# Patient Record
Sex: Female | Born: 1990 | Race: White | Hispanic: Yes | Marital: Married | Smoking: Never smoker
Health system: Southern US, Community
[De-identification: ages and names within clinical notes are randomized; demographics above are authoritative.]

## PROBLEM LIST (undated history)

## (undated) ENCOUNTER — Inpatient Hospital Stay (HOSPITAL_COMMUNITY): Payer: Self-pay

## (undated) DIAGNOSIS — D649 Anemia, unspecified: Secondary | ICD-10-CM

## (undated) DIAGNOSIS — Z202 Contact with and (suspected) exposure to infections with a predominantly sexual mode of transmission: Secondary | ICD-10-CM

## (undated) HISTORY — DX: Contact with and (suspected) exposure to infections with a predominantly sexual mode of transmission: Z20.2

## (undated) HISTORY — DX: Anemia, unspecified: D64.9

## (undated) HISTORY — PX: NO PAST SURGERIES: SHX2092

---

## 2013-03-28 ENCOUNTER — Other Ambulatory Visit (HOSPITAL_COMMUNITY): Payer: Self-pay | Admitting: Nurse Practitioner

## 2013-03-28 DIAGNOSIS — Z3689 Encounter for other specified antenatal screening: Secondary | ICD-10-CM

## 2013-03-28 LAB — OB RESULTS CONSOLE RPR: RPR: NONREACTIVE

## 2013-03-28 LAB — OB RESULTS CONSOLE GC/CHLAMYDIA
Chlamydia: NEGATIVE
GC PROBE AMP, GENITAL: NEGATIVE

## 2013-03-28 LAB — OB RESULTS CONSOLE ABO/RH: RH TYPE: POSITIVE

## 2013-03-28 LAB — OB RESULTS CONSOLE HIV ANTIBODY (ROUTINE TESTING): HIV: NONREACTIVE

## 2013-03-28 LAB — OB RESULTS CONSOLE ANTIBODY SCREEN: ANTIBODY SCREEN: NEGATIVE

## 2013-03-28 LAB — OB RESULTS CONSOLE HEPATITIS B SURFACE ANTIGEN: Hepatitis B Surface Ag: NEGATIVE

## 2013-03-28 LAB — OB RESULTS CONSOLE RUBELLA ANTIBODY, IGM: Rubella: IMMUNE

## 2013-04-26 ENCOUNTER — Ambulatory Visit (HOSPITAL_COMMUNITY)
Admission: RE | Admit: 2013-04-26 | Discharge: 2013-04-26 | Disposition: A | Discharge status: Home / Self Care | Payer: Medicaid Other | Source: Ambulatory Visit | Attending: Nurse Practitioner | Admitting: Nurse Practitioner

## 2013-04-26 DIAGNOSIS — Z3689 Encounter for other specified antenatal screening: Secondary | ICD-10-CM

## 2013-05-19 NOTE — L&D Delivery Note (Signed)
Delivery Note At 2:54 PM a viable female was delivered via Vaginal, Spontaneous Delivery (Presentation: Right Occiput Anterior).  APGAR: 9, 9; weight: TBD.   Placenta status: Intact, Spontaneous.  Cord: 3 vessels with the following complications: none  Anesthesia: Epidural  Episiotomy: None Lacerations: 2nd degree perineal lac Suture Repair: 3.0 monocryl Est. Blood Loss (mL): 400cc  Mom to postpartum.  Baby to Couplet care / Skin to Skin.  Upon arrival patient was complete and had been pushing well for >1 hour. She continued to push with good effort, and delivered a healthy baby boy, placed on maternal abdomen for oral suctioning, drying and stimulation. Delayed cord clamping performed and cut by FOB. Placenta delivered intact with 3V cord. Vaginal canal and perineum was inspected with 2nd degree perineal laceration with angled extension to laceration, repaired in the usual fashion with 2x 3.0 monocryl suture repair, hemostatic on completion. Pitocin was started and uterus massaged until bleeding slowed. Counts of sharps, instruments, and lap pads were all correct.    Saralyn PilarAlexander Karamalegos, DO [facility], PGY-1 09/22/2013, 3:20 PM   I spoke with and examined patient and agree with resident's note and plan of care.  Tawana ScaleMichael Ryan Malavika Lira, MD OB Fellow 09/22/2013 4:56 PM

## 2013-05-31 ENCOUNTER — Encounter (HOSPITAL_COMMUNITY): Payer: Self-pay | Admitting: *Deleted

## 2013-05-31 ENCOUNTER — Inpatient Hospital Stay (HOSPITAL_COMMUNITY)
Admission: AD | Admit: 2013-05-31 | Discharge: 2013-06-01 | Disposition: A | Discharge status: Home / Self Care | Payer: Medicaid Other | Source: Ambulatory Visit | Attending: Obstetrics & Gynecology | Admitting: Obstetrics & Gynecology

## 2013-05-31 DIAGNOSIS — O9989 Other specified diseases and conditions complicating pregnancy, childbirth and the puerperium: Principal | ICD-10-CM

## 2013-05-31 DIAGNOSIS — R1013 Epigastric pain: Secondary | ICD-10-CM

## 2013-05-31 DIAGNOSIS — O99891 Other specified diseases and conditions complicating pregnancy: Secondary | ICD-10-CM | POA: Insufficient documentation

## 2013-05-31 NOTE — MAU Note (Signed)
PT SAYS  AT  9PM SHE STARTED  ON HER LEFT SIDE OF ABD-   NOW PAIN IS SAME.-   NO MED FOR PAIN.  GETS PNC AT [facility]- SEEN LAST   IN JAN  5TH.  NEXT IS 1-26.   LAST  SEX- 1-10.     DENIES AB=NY S/S  WHEN VOIDING.

## 2013-06-01 LAB — URINALYSIS, ROUTINE W REFLEX MICROSCOPIC
Bilirubin Urine: NEGATIVE
Glucose, UA: NEGATIVE mg/dL
Hgb urine dipstick: NEGATIVE
Ketones, ur: NEGATIVE mg/dL
Leukocytes, UA: NEGATIVE
NITRITE: NEGATIVE
PH: 5.5 (ref 5.0–8.0)
Protein, ur: NEGATIVE mg/dL
Specific Gravity, Urine: 1.01 (ref 1.005–1.030)
Urobilinogen, UA: 0.2 mg/dL (ref 0.0–1.0)

## 2013-06-01 NOTE — MAU Provider Note (Signed)
Chief Complaint:  No chief complaint on file.   First Provider Initiated Contact with Patient 06/01/13 0013      HPI: Connie Ferguson is a 23 y.o. G1P0000 at 1375w2d who presents to maternity admissions reporting epigastric pain that began around 9 PM tonight.  Pt describes as minimal pain, constant in nature, not reproducible, does not change with movement, and she has never had this before.  Non radiating in nature, she does not have any nausea or vomiting, and states she has had good fetal movement w/o contractions, leakage of fluid or vaginal bleeding. Last sexual intercourse 3 days ago and denies any dysuria, hematuria.  Pt denies hx of reflux or recent spicy meals.  No SOB, CP, or palpitations.   Pregnancy Course: Uncomplicated to this point   Past Medical History: History reviewed. No pertinent past medical history.  Past obstetric history: OB History  Gravida Para Term Preterm AB SAB TAB Ectopic Multiple Living  1 0 0 0 0 0 0 0 0 0     # Outcome Date GA Lbr Len/2nd Weight Sex Delivery Anes PTL Lv  1 CUR               Past Surgical History: Past Surgical History  Procedure Laterality Date  . No past surgeries       Family History: History reviewed. No pertinent family history.  Social History: History  Substance Use Topics  . Smoking status: Never Smoker   . Smokeless tobacco: Not on file  . Alcohol Use: No    Allergies: No Known Allergies  Meds:  Prescriptions prior to admission  Medication Sig Dispense Refill  . Prenatal Vit-Fe Fumarate-FA (PRENATAL MULTIVITAMIN) TABS tablet Take 1 tablet by mouth daily at 12 noon.        ROS: Pertinent findings in history of present illness.  Physical Exam  Blood pressure 104/64, pulse 77, temperature 98 F (36.7 C), temperature source Oral, resp. rate 18, height 5\' 2"  (1.575 m), weight 64.071 kg (141 lb 4 oz). GENERAL: Well-developed, well-nourished female in no acute distress.  HEENT: normocephalic HEART:  normal rate RESP: normal effort ABDOMEN: Soft, non-tender, gravid appropriate for gestational age, no reproducible TTP, no guarding or rigidity, no HSM EXTREMITIES: Nontender, no edema NEURO: alert and oriented  FHT:  Baseline 140 , moderate variability, accelerations present, no decelerations Contractions: Absent    Labs: Results for orders placed during the hospital encounter of 05/31/13 (from the past 24 hour(s))  URINALYSIS, ROUTINE W REFLEX MICROSCOPIC     Status: None   Collection Time    05/31/13 11:36 PM      Result Value Range   Color, Urine YELLOW  YELLOW   APPearance CLEAR  CLEAR   Specific Gravity, Urine 1.010  1.005 - 1.030   pH 5.5  5.0 - 8.0   Glucose, UA NEGATIVE  NEGATIVE mg/dL   Hgb urine dipstick NEGATIVE  NEGATIVE   Bilirubin Urine NEGATIVE  NEGATIVE   Ketones, ur NEGATIVE  NEGATIVE mg/dL   Protein, ur NEGATIVE  NEGATIVE mg/dL   Urobilinogen, UA 0.2  0.0 - 1.0 mg/dL   Nitrite NEGATIVE  NEGATIVE   Leukocytes, UA NEGATIVE  NEGATIVE    Imaging:  No results found.  Assessment: 1. Epigastric pain    Plan: Unsure of etiology but could be reflux.  However, pt in NAD, her abdominal exam is normal and her FHT is reassuring.  Recommend trying some OTC Zantac, Tums, and tylenol for discomfort.  Discharge home  with specific instructions on return to MAU  Labor precautions and fetal kick counts    Medication List         prenatal multivitamin Tabs tablet  Take 1 tablet by mouth daily at 12 noon.        Briscoe Deutscher, DO 06/01/2013 12:20 AM  I was consulted regarding plan of care and agree with the above.  [place], CNM 06/03/2013 6:03 AM

## 2013-06-01 NOTE — Discharge Instructions (Signed)
Dolor abdominal en el embarazo  (Abdominal Pain During Pregnancy)  El dolor abdominal es frecuente durante el embarazo. Generalmente no causa ningún daño. El dolor abdominal puede tener numerosas causas. Algunas causas son más graves que otras. Ciertas causas de dolor abdominal durante el embarazo se diagnostican fácilmente. A veces, se tarda un tiempo para llegar al diagnóstico. Otras veces la causa no se conoce. El dolor abdominal puede estar relacionado con alguna alteración del embarazo, o puede deberse a una causa totalmente diferente. Por este motivo, siempre consulte a su médico cuando sienta molestias abdominales.  INSTRUCCIONES PARA EL CUIDADO EN EL HOGAR   Esté atenta al dolor para ver si hay cambios. Las siguientes indicaciones ayudarán a aliviar cualquier molestia que pueda sentir:  · No tenga relaciones sexuales y no coloque nada dentro de la vagina hasta que los síntomas hayan desaparecido completamente.  · Descanse todo lo que pueda hasta que el dolor se le haya calmado.  · Si siente náuseas, beba líquidos claros. Evite los alimentos sólidos mientras sienta malestar o tenga náuseas.  · Tome sólo medicamentos de venta libre o recetados, según las indicaciones del médico.  · Cumpla con todas las visitas de control, según le indique su médico.  SOLICITE ATENCIÓN MÉDICA DE INMEDIATO SI:  · Tiene un sangrado, pérdida de líquidos o elimina tejidos por la vagina.  · El dolor o los cólicos aumentan.  · Tiene vómitos persistentes.  · Comienza a sentir dolor al orinar u observa sangre.  · Tiene fiebre.  · Nota que los movimientos del bebé disminuyen.  · Siente intensa debilidad o se marea.  · Tiene dificultad para respirar con o sin dolor abdominal.  · Siente un dolor de cabeza intenso junto al dolor abdominal.  · Tiene una secreción vaginal anormal con dolor abdominal.  · Tiene diarrea persistente.  · El dolor abdominal sigue o empeora aún después de hacer reposo.  ASEGÚRESE DE QUE:   · Comprende estas  instrucciones.  · Controlará su afección.  · Recibirá ayuda de inmediato si no mejora o si empeora.  Document Released: 05/05/2005 Document Revised: 02/23/2013  ExitCare® Patient Information ©2014 [employer].

## 2013-06-05 NOTE — MAU Provider Note (Signed)
Attestation of Attending Supervision of Advanced Practitioner (CNM/NP): Evaluation and management procedures were performed by the Advanced Practitioner under my supervision and collaboration. I have reviewed the Advanced Practitioner's note and chart, and I agree with the management and plan.  LEGGETT,KELLY H. 9:13 PM

## 2013-06-27 ENCOUNTER — Other Ambulatory Visit: Payer: Self-pay

## 2013-07-13 ENCOUNTER — Ambulatory Visit (HOSPITAL_COMMUNITY): Payer: Medicaid Other

## 2013-07-19 ENCOUNTER — Ambulatory Visit (HOSPITAL_COMMUNITY)
Admission: RE | Admit: 2013-07-19 | Discharge: 2013-07-19 | Disposition: A | Discharge status: Home / Self Care | Payer: Medicaid Other | Source: Ambulatory Visit | Attending: Obstetrics & Gynecology | Admitting: Obstetrics & Gynecology

## 2013-07-19 DIAGNOSIS — O352XX0 Maternal care for (suspected) hereditary disease in fetus, not applicable or unspecified: Secondary | ICD-10-CM

## 2013-07-19 NOTE — ED Notes (Signed)
Appointment Date: 07/19/2013 DOB: 06-15-1990 Referring Provider: [place] Attending: Renella Cunas, MD  I met with [name] and her husband, 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  Spanish interpreter was provided.  We reviewed the remainder of their family histories.  [name] has not yet had hemoglobin electrophoresis, as best he knows.  [name] reported no relatives known to be beta thalassemia carriers and no known relatives with beta thalassemia.  [name] reported no known individuals with beta thalassemia in his family history, and consanguinity to [name] was denied. He reported that his family is from [place]. Both family histories were reviewed and were otherwise negative for birth defects, mental retardation, and known genetic conditions. Without further information regarding the provided family history, an accurate genetic risk cannot be calculated. Further genetic counseling is warranted if more information is obtained.  We reviewed the results of [name]'s hemoglobin electrophoresis.  We reviewed that hemoglobin is a protein in red blood cells that carries oxygen to the bodys organs. There are multiple types of hemoglobin, and these are comprised of different subunits.  We discussed that the primary type of adult hemoglobin is hemoglobin A (HbA).  It is a tetramer composed of two alpha chains and two beta chains.  [name] results showed that she has a reduced level of HbA, increased levels of Hb A2 and increased levels of HbF.  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  [name] reports that she is from [place]. The  carrier frequency of beta thalassemia in [place] is 1 in 91.  We discussed that beta thalassemia is a genetic condition characterized by reduced synthesis of the beta globin chain.  Individuals with beta thalassemia have a change within each copy of the gene that codes for the beta globin chain.  They were counseled that beta thalassemia results in microcytic, hypochromic anemia.  The anemia is severe and typically leads to hepatosplenomegaly.  Symptoms become obvious within the first two years of life, as a baby tries to transition from fetal hemoglobin to hemoglobin A. Beta thalassemia is typically treated with regular transfusions and chelation therapy, aimed at reducing transfusion iron overload.  We discussed that this treatment regimen allows for normal growth and development and may improve the overall prognosis.  Without treatment, individuals with beta-thalassemia major have failure to thrive, feeding problems, and a shortened lifespan.   They were counseled that beta-thalassemia is inherited in an autosomal recessive manner, and occurs when both copies of the beta hemoglobin gene are changed. They understand that in order to have a child with beta thalassemia, they would both have to be carriers of a beta globin chain variant.   We discussed that beta thalassemia trait can be inherited with other beta globin chain variants, such as sickle cell trait (Hb AS) to also cause other variant hemoglobinopathies, such as sickle- beta thalassemia.  Carriers of recessive conditions typically do not have symptoms related to the condition, because they still have one functioning copy of the gene, and thus some production of the typical protein coded for by that gene.   Given the recessive inheritance, we discussed the importance of understanding [name]' status in order to accurately predict the risk of beta-thalassemia  or other hemoglobinopathy in the fetus. We discussed the option of hemoglobin  electrophoresis with a complete blood count and serum ferritin levels to determine whether he has beta-thalassemia trait, or any hemoglobin variant (including sickle cell trait). [name] declined testing today.  We discussed that this screening can be facilitated through his 59 office later if he wishes.  They understand that an accurate risk assessment cannot be performed without further information.  However, given that [name] is from [place], we discussed that the chance for him to be a carrier of Hb S is approximate 1 in 9, the chance for him to be a carrier for Hb C is approximately 1 in 8 and the chance for him to be a carrier for beta thalassemia is approximately 1 in 84.   This considered, the risk for a clinically significant hemoglobinopathy is approximately 1 in 38. They understand that if [name] has typical hemoglobin, then the pregnancy would not be at risk to inherit a hemoglobinopathy but would have a 1 in 2 chance to have beta-thalassemia trait like [name].   They were also counseled about newborn screening in [place] for hemoglobinopathies.  Given her late gestation, they decided to wait until newborn screening to learn more.    [name] denied exposure to environmental toxins or chemical agents. She denied the use of alcohol, tobacco or street drugs. She denied significant viral illnesses during the course of her pregnancy. Her medical and surgical histories were noncontributory.   I counseled this couple, through a [place] interpreter, regarding the above risks and available options.  The approximate face-to-face time with the genetic counselor was 45 minutes.  Cam Hai, MS Certified Genetic Counselor 07/19/2013

## 2013-08-22 LAB — OB RESULTS CONSOLE GBS: GBS: NEGATIVE

## 2013-09-19 ENCOUNTER — Other Ambulatory Visit (HOSPITAL_COMMUNITY): Payer: Self-pay | Admitting: Nurse Practitioner

## 2013-09-19 DIAGNOSIS — O480 Post-term pregnancy: Secondary | ICD-10-CM

## 2013-09-20 ENCOUNTER — Telehealth (HOSPITAL_COMMUNITY): Payer: Self-pay | Admitting: *Deleted

## 2013-09-20 ENCOUNTER — Encounter (HOSPITAL_COMMUNITY): Payer: Self-pay | Admitting: *Deleted

## 2013-09-20 NOTE — Telephone Encounter (Signed)
Preadmission screen [id number] interpreter number

## 2013-09-21 ENCOUNTER — Encounter (HOSPITAL_COMMUNITY): Payer: Self-pay

## 2013-09-21 ENCOUNTER — Inpatient Hospital Stay (HOSPITAL_COMMUNITY)
Admission: RE | Admit: 2013-09-21 | Discharge: 2013-09-24 | DRG: 775 | Disposition: A | Discharge status: Home / Self Care | Payer: Medicaid Other | Source: Ambulatory Visit | Attending: Obstetrics and Gynecology | Admitting: Obstetrics and Gynecology

## 2013-09-21 DIAGNOSIS — Z833 Family history of diabetes mellitus: Secondary | ICD-10-CM

## 2013-09-21 DIAGNOSIS — O9902 Anemia complicating childbirth: Secondary | ICD-10-CM | POA: Diagnosis present

## 2013-09-21 DIAGNOSIS — D649 Anemia, unspecified: Secondary | ICD-10-CM | POA: Diagnosis present

## 2013-09-21 DIAGNOSIS — O480 Post-term pregnancy: Secondary | ICD-10-CM | POA: Diagnosis present

## 2013-09-21 DIAGNOSIS — Z3A42 42 weeks gestation of pregnancy: Secondary | ICD-10-CM

## 2013-09-21 LAB — RPR

## 2013-09-21 LAB — CBC
HEMATOCRIT: 30.3 % — AB (ref 36.0–46.0)
Hemoglobin: 9.9 g/dL — ABNORMAL LOW (ref 12.0–15.0)
MCH: 21.2 pg — AB (ref 26.0–34.0)
MCHC: 32.7 g/dL (ref 30.0–36.0)
MCV: 64.9 fL — AB (ref 78.0–100.0)
PLATELETS: 267 10*3/uL (ref 150–400)
RBC: 4.67 MIL/uL (ref 3.87–5.11)
RDW: 15.9 % — AB (ref 11.5–15.5)
WBC: 12.7 10*3/uL — AB (ref 4.0–10.5)

## 2013-09-21 LAB — TYPE AND SCREEN
ABO/RH(D): O POS
Antibody Screen: NEGATIVE

## 2013-09-21 LAB — ABO/RH: ABO/RH(D): O POS

## 2013-09-21 MED ORDER — OXYTOCIN 40 UNITS IN LACTATED RINGERS INFUSION - SIMPLE MED
62.5000 mL/h | INTRAVENOUS | Status: DC
  Filled 2013-09-21: qty 1000

## 2013-09-21 MED ORDER — ACETAMINOPHEN 325 MG PO TABS: 650.0000 mg | ORAL_TABLET | ORAL | Status: DC | PRN

## 2013-09-21 MED ORDER — MISOPROSTOL 25 MCG QUARTER TABLET
25.0000 ug | ORAL_TABLET | ORAL | Status: DC | PRN
  Administered 2013-09-21: 25 ug via VAGINAL
  Filled 2013-09-21: qty 1
  Filled 2013-09-21: qty 0.25

## 2013-09-21 MED ORDER — IBUPROFEN 600 MG PO TABS
600.0000 mg | ORAL_TABLET | Freq: Four times a day (QID) | ORAL | Status: DC | PRN
  Administered 2013-09-22: 600 mg via ORAL
  Filled 2013-09-21: qty 1

## 2013-09-21 MED ORDER — TERBUTALINE SULFATE 1 MG/ML IJ SOLN: 0.2500 mg | Freq: Once | INTRAMUSCULAR | Status: AC | PRN

## 2013-09-21 MED ORDER — DIPHENHYDRAMINE HCL 50 MG/ML IJ SOLN: 12.5000 mg | INTRAMUSCULAR | Status: DC | PRN

## 2013-09-21 MED ORDER — PHENYLEPHRINE 40 MCG/ML (10ML) SYRINGE FOR IV PUSH (FOR BLOOD PRESSURE SUPPORT)
80.0000 ug | PREFILLED_SYRINGE | INTRAVENOUS | Status: DC | PRN
  Administered 2013-09-22: 80 ug via INTRAVENOUS
  Filled 2013-09-21: qty 2

## 2013-09-21 MED ORDER — LACTATED RINGERS IV SOLN
INTRAVENOUS | Status: DC
  Administered 2013-09-21 – 2013-09-22 (×4): via INTRAVENOUS

## 2013-09-21 MED ORDER — LACTATED RINGERS IV SOLN: 500.0000 mL | INTRAVENOUS | Status: DC | PRN

## 2013-09-21 MED ORDER — FENTANYL CITRATE 0.05 MG/ML IJ SOLN
INTRAMUSCULAR | Status: AC
  Administered 2013-09-21: 100 ug via INTRAVENOUS
  Filled 2013-09-21: qty 2

## 2013-09-21 MED ORDER — FENTANYL CITRATE 0.05 MG/ML IJ SOLN
100.0000 ug | INTRAMUSCULAR | Status: DC | PRN
  Administered 2013-09-21 – 2013-09-22 (×3): 100 ug via INTRAVENOUS
  Filled 2013-09-21 (×2): qty 2

## 2013-09-21 MED ORDER — ONDANSETRON HCL 4 MG/2ML IJ SOLN: 4.0000 mg | Freq: Four times a day (QID) | INTRAMUSCULAR | Status: DC | PRN

## 2013-09-21 MED ORDER — FENTANYL 2.5 MCG/ML BUPIVACAINE 1/10 % EPIDURAL INFUSION (WH - ANES)
14.0000 mL/h | INTRAMUSCULAR | Status: DC | PRN
  Administered 2013-09-22: 14 mL/h via EPIDURAL
  Filled 2013-09-21 (×2): qty 125

## 2013-09-21 MED ORDER — OXYTOCIN 40 UNITS IN LACTATED RINGERS INFUSION - SIMPLE MED
1.0000 m[IU]/min | INTRAVENOUS | Status: DC
  Administered 2013-09-21: 2 m[IU]/min via INTRAVENOUS

## 2013-09-21 MED ORDER — OXYCODONE-ACETAMINOPHEN 5-325 MG PO TABS: 1.0000 | ORAL_TABLET | ORAL | Status: DC | PRN

## 2013-09-21 MED ORDER — LACTATED RINGERS IV SOLN: 500.0000 mL | Freq: Once | INTRAVENOUS | Status: DC

## 2013-09-21 MED ORDER — OXYTOCIN BOLUS FROM INFUSION: 500.0000 mL | INTRAVENOUS | Status: DC

## 2013-09-21 MED ORDER — EPHEDRINE 5 MG/ML INJ
10.0000 mg | INTRAVENOUS | Status: DC | PRN
  Filled 2013-09-21: qty 2
  Filled 2013-09-21: qty 4

## 2013-09-21 MED ORDER — EPHEDRINE 5 MG/ML INJ
10.0000 mg | INTRAVENOUS | Status: DC | PRN
  Filled 2013-09-21: qty 2

## 2013-09-21 MED ORDER — PHENYLEPHRINE 40 MCG/ML (10ML) SYRINGE FOR IV PUSH (FOR BLOOD PRESSURE SUPPORT)
80.0000 ug | PREFILLED_SYRINGE | INTRAVENOUS | Status: DC | PRN
  Filled 2013-09-21: qty 10
  Filled 2013-09-21: qty 2
  Filled 2013-09-21: qty 10

## 2013-09-21 MED ORDER — ZOLPIDEM TARTRATE 5 MG PO TABS: 5.0000 mg | ORAL_TABLET | Freq: Every evening | ORAL | Status: DC | PRN

## 2013-09-21 MED ORDER — CITRIC ACID-SODIUM CITRATE 334-500 MG/5ML PO SOLN: 30.0000 mL | ORAL | Status: DC | PRN

## 2013-09-21 MED ORDER — LIDOCAINE HCL (PF) 1 % IJ SOLN
30.0000 mL | INTRAMUSCULAR | Status: DC | PRN
  Filled 2013-09-21: qty 30

## 2013-09-21 NOTE — H&P (Signed)
Janeshia Alethia BertholdLeticia Carman Chingonce Melendez is a 23 y.o. female G1P0000 with IUP at 5160w2d presenting for IOL for postdates.  Interview conducted with assistance of Spanish interpreter in room.  Patient reports that she is feeling irregular occasional contractions 3-4x daily, without significant change recently. No other complaints at this time. States she was scheduled for recent US, but was sent here instead.  Admits good Fetal Movement. Denies any leakage of fluids, vaginal bleeding.  [facility]/Complications[facility], dating by LMP/US. Pregnancy without any significant complications. Reviewed prenatal records, documented anemia in pregnancy, previously treated trichomonas. Denies any other complications or hospitalizations.  Past Medical History: Past Medical History  Diagnosis Date  . Anemia   . Trichomonas contact, treated    Denies any other prior medical hx or current medications.  Past Surgical History: Past Surgical History  Procedure Laterality Date  . No past surgeries      Obstetrical History: OB History   Grav Para Term Preterm Abortions TAB SAB Ect Mult Living   1 0 0 0 0 0 0 0 0 0       Social History: History   Social History  . Marital Status: Significant Other    Spouse Name: N/A    Number of Children: N/A  . Years of Education: N/A   Social History Main Topics  . Smoking status: Never Smoker   . Smokeless tobacco: Never Used  . Alcohol Use: No  . Drug Use: No  . Sexual Activity: Yes   Other Topics Concern  . None   Social History Narrative  . None    Family History: Family History  Problem Relation Age of Onset  . Diabetes Mother   . Alcohol abuse Neg Hx   . Arthritis Neg Hx   . Asthma Neg Hx   . Birth defects Neg Hx   . Cancer Neg Hx   . COPD Neg Hx   . Depression Neg Hx   . Drug abuse Neg Hx   . Early death Neg Hx   . Hearing loss Neg Hx   . Heart disease Neg Hx   . Hyperlipidemia Neg Hx   . Hypertension Neg Hx    . Kidney disease Neg Hx   . Learning disabilities Neg Hx   . Mental illness Neg Hx   . Mental retardation Neg Hx   . Miscarriages / Stillbirths Neg Hx   . Stroke Neg Hx   . Vision loss Neg Hx     Allergies: No Known Allergies  Prescriptions prior to admission  Medication Sig Dispense Refill  . calcium carbonate (TUMS - DOSED IN MG ELEMENTAL CALCIUM) 500 MG chewable tablet Chew 1 tablet by mouth daily as needed for indigestion or heartburn.      . Prenatal Vit-Fe Fumarate-FA (PRENATAL MULTIVITAMIN) TABS tablet Take 1 tablet by mouth daily at 12 noon.        Review of Systems: Negative unless otherwise stated in History above  Physicial Blood pressure 124/74, pulse 99, temperature 98.5 F (36.9 C), temperature source Oral, resp. rate 18, height 5\' 2"  (1.575 m), weight 74.844 kg (165 lb). General appearance: alert and cooperative, comfortable not feeling UCs, NAD Lungs: CTAB Heart: RRR, no murmurs Abdomen: soft, non-tender, gravid appropriate for GA, bowel sounds normal Extremities: Homans sign is negative, no sign of DVT DTR's +2 b/l patellar, no clonus Presentation: cephalic (confirmed bedside US)  Fetal monitoring Baseline: 130 bpm, Variability: moderate, Accelerations: Reactive and Decelerations: Absent Uterine activity: irregular  UCs q 6-3610min on toco.     Prenatal labs: ABO, Rh: O/Positive/-- (11/10 0000) Antibody: Negative (11/10 0000) Rubella:   immune RPR: Nonreactive (11/10 0000)  HBsAg: Negative (11/10 0000)  HIV: Non-reactive (11/10 0000)  GBS: Negative (04/06 0000)  GTT: 1 hr screen, 81  Prenatal Transfer Tool  Maternal Diabetes: No Genetic Screening: Normal Maternal Ultrasounds/Referrals: Normal Fetal Ultrasounds or other Referrals:  None Maternal Substance Abuse:  No Significant Maternal Medications:  None Significant Maternal Lab Results: Lab values include: Group B Strep negative  No results found for this or any previous visit (from the past 24  hour(s)).  Assessment: Nanda QuintonFlor [name] is a 23 y.o. G1P0000 at 6536w2d by LMP/US, admitted for IOL for post-dates.  #Labor: IOL, active management. Start Cytotec 25mcg PV q 4 hr, may have regular diet while cytotec and irregular UCs, with progression switch to clear liquid / light labor diet #Pain: consider Fentanyl IV PRN, planning Epidural #FWB: CAT-1, EFW (Leopold's estimate 7 lb) #ID: GBS (negative) #Feeding: Breast / Bottle #MOC: Depo-provera #Circ: Not in hospital  Saralyn PilarAlexander Karamalegos, DO [facility], PGY-1  09/21/2013, 8:59 AM   I spoke with and examined patient and agree with resident's note and plan of care.  Tawana ScaleMichael Ryan Jael Waldorf, MD OB Fellow 09/21/2013 11:32 AM

## 2013-09-21 NOTE — Progress Notes (Signed)
Connie Alethia BertholdLeticia Carman Chingonce Ferguson is a 23 y.o. G1P0000 at 2643w2d   Subjective: Mostly comfortable  Objective: BP 114/72  Pulse 81  Temp(Src) 98.4 F (36.9 C) (Oral)  Resp 18  Ht 5\' 2"  (1.575 m)  Wt 74.844 kg (165 lb)  BMI 30.17 kg/m2      FHT:  FHR: 130s bpm, variability: moderate,  accelerations:  Present,  decelerations:  Absent UC:   irregular, every 3-6 minutes SVE:   Dilation: 4.5 Effacement (%): 50 Station: -3 Exam by:: Connie Ferguson CNM  Labs: Lab Results  Component Value Date   WBC 12.7* 09/21/2013   HGB 9.9* 09/21/2013   HCT 30.3* 09/21/2013   MCV 64.9* 09/21/2013   PLT 267 09/21/2013    Assessment / Plan: IOL process for postdates Favorable cx  Begin Pitocin and titrate to achieve adequate labor Connie MerlesKimberly D [name] 09/21/2013, 11:42 PM

## 2013-09-21 NOTE — Progress Notes (Signed)
Connie Ferguson is a 23 y.o. G1P0000 at 1990w2d by LMP/US admitted for induction of labor due to Post dates.  Subjective: Comfortable resting in bed. Denies feeling any pain with regular UCs.  Objective: BP 116/70  Pulse 89  Temp(Src) 98.9 F (37.2 C) (Oral)  Resp 18  Ht 5\' 2"  (1.575 m)  Wt 74.844 kg (165 lb)  BMI 30.17 kg/m2      FHT:  FHR: 135 bpm, variability: moderate,  accelerations:  Present,  decelerations:  Absent UC:   regular, every 2-3 minutes, palpable tightening (not painful) SVE:   Dilation: 1.5 Effacement (%): 50 Station: -3 Exam by:: dr. Blythe Stanfordkaramelagos  Labs: Lab Results  Component Value Date   WBC 12.7* 09/21/2013   HGB 9.9* 09/21/2013   HCT 30.3* 09/21/2013   MCV 64.9* 09/21/2013   PLT 267 09/21/2013    Assessment / Plan: Connie Ferguson is a 23 y.o. G1P0000 at 4990w2d by LMP/US admitted for IOL postdates  Labor: IOL. S/p Cytotec PV x 1 dose. Small cervical progress to 1.5/50%, inc ctx 2-3 min on monitor. Hold cytotec due to ctx, and placed FB @ 1407.  Preeclampsia:  n/a Fetal Wellbeing:  Category I Pain Control:  No pain meds currently. Planning for Epidural. Considering Fentanyl IV I/D:  GBS (negative) Anticipated MOD:  NSVD  Saralyn PilarAlexander Karamalegos, DO [facility], PGY-1 09/21/2013, 2:11 PM

## 2013-09-21 NOTE — Progress Notes (Signed)
Connie Alethia BertholdLeticia Carman Chingonce Ferguson is a 23 y.o. G1P0000 at 5124w2d by LMP/US admitted for induction of labor due to Post dates.  Subjective: Increasing pain and pelvic pressure with regular UCs. Requesting IV pain medicine, still planning for epidural in future.  FB came out around 1600.  Objective: BP 116/70  Pulse 89  Temp(Src) 98.9 F (37.2 C) (Oral)  Resp 18  Ht 5\' 2"  (1.575 m)  Wt 74.844 kg (165 lb)  BMI 30.17 kg/m2      FHT:  FHR: 130 bpm, variability: moderate,  accelerations:  Present,  decelerations:  Absent UC:   regular, every 1.5 - 2 minutes SVE:   Dilation: 4.5 Effacement (%): 50 Station: -3 Exam by:: hk/  Labs: Lab Results  Component Value Date   WBC 12.7* 09/21/2013   HGB 9.9* 09/21/2013   HCT 30.3* 09/21/2013   MCV 64.9* 09/21/2013   PLT 267 09/21/2013    Assessment / Plan: Connie Ferguson Connie Ferguson is a 23 y.o. G1P0000 at 3624w2d by LMP/US admitted for IOL postdates  Labor: IOL. S/p Cytotec PV x 1, FB out 1600 (in x 2 hours). Good cervical progress, regular frequent ctx. Continue to monitor and re-check. No pitocin at this time.  Preeclampsia:  n/a Fetal Wellbeing:  Category I Pain Control:  Fentanyl IV PRN. Planning epidural I/D:  GBS (negative) Anticipated MOD:  NSVD  Connie PilarAlexander Joclynn Lumb, DO [facility], PGY-1 09/21/2013, 4:11 PM

## 2013-09-22 ENCOUNTER — Inpatient Hospital Stay (HOSPITAL_COMMUNITY): Payer: Medicaid Other | Admitting: Anesthesiology

## 2013-09-22 ENCOUNTER — Inpatient Hospital Stay (HOSPITAL_COMMUNITY): Admission: RE | Admit: 2013-09-22 | Payer: Medicaid Other | Source: Ambulatory Visit

## 2013-09-22 ENCOUNTER — Encounter (HOSPITAL_COMMUNITY): Payer: Self-pay

## 2013-09-22 ENCOUNTER — Encounter (HOSPITAL_COMMUNITY): Payer: Medicaid Other | Admitting: Anesthesiology

## 2013-09-22 MED ORDER — TETANUS-DIPHTH-ACELL PERTUSSIS 5-2.5-18.5 LF-MCG/0.5 IM SUSP
0.5000 mL | Freq: Once | INTRAMUSCULAR | Status: AC
  Administered 2013-09-23: 0.5 mL via INTRAMUSCULAR
  Filled 2013-09-22: qty 0.5

## 2013-09-22 MED ORDER — LANOLIN HYDROUS EX OINT: TOPICAL_OINTMENT | CUTANEOUS | Status: DC | PRN

## 2013-09-22 MED ORDER — OXYCODONE-ACETAMINOPHEN 5-325 MG PO TABS
1.0000 | ORAL_TABLET | ORAL | Status: DC | PRN
  Administered 2013-09-23: 1 via ORAL
  Filled 2013-09-22: qty 1

## 2013-09-22 MED ORDER — ONDANSETRON HCL 4 MG PO TABS: 4.0000 mg | ORAL_TABLET | ORAL | Status: DC | PRN

## 2013-09-22 MED ORDER — LIDOCAINE HCL (PF) 1 % IJ SOLN
INTRAMUSCULAR | Status: DC | PRN
  Administered 2013-09-22: 5 mL
  Administered 2013-09-22: 3 mL
  Administered 2013-09-22: 5 mL

## 2013-09-22 MED ORDER — WITCH HAZEL-GLYCERIN EX PADS: 1.0000 "application " | MEDICATED_PAD | CUTANEOUS | Status: DC | PRN

## 2013-09-22 MED ORDER — BENZOCAINE-MENTHOL 20-0.5 % EX AERO
1.0000 "application " | INHALATION_SPRAY | CUTANEOUS | Status: DC | PRN
  Administered 2013-09-22: 1 via TOPICAL
  Filled 2013-09-22: qty 56

## 2013-09-22 MED ORDER — ZOLPIDEM TARTRATE 5 MG PO TABS: 5.0000 mg | ORAL_TABLET | Freq: Every evening | ORAL | Status: DC | PRN

## 2013-09-22 MED ORDER — IBUPROFEN 600 MG PO TABS
600.0000 mg | ORAL_TABLET | Freq: Four times a day (QID) | ORAL | Status: DC
Start: 2013-09-22 — End: 2013-09-24
  Administered 2013-09-22 – 2013-09-24 (×7): 600 mg via ORAL
  Filled 2013-09-22 (×7): qty 1

## 2013-09-22 MED ORDER — PRENATAL MULTIVITAMIN CH
1.0000 | ORAL_TABLET | Freq: Every day | ORAL | Status: DC
  Administered 2013-09-23 – 2013-09-24 (×2): 1 via ORAL
  Filled 2013-09-22 (×2): qty 1

## 2013-09-22 MED ORDER — DIBUCAINE 1 % RE OINT: 1.0000 "application " | TOPICAL_OINTMENT | RECTAL | Status: DC | PRN

## 2013-09-22 MED ORDER — DIPHENHYDRAMINE HCL 25 MG PO CAPS: 25.0000 mg | ORAL_CAPSULE | Freq: Four times a day (QID) | ORAL | Status: DC | PRN

## 2013-09-22 MED ORDER — SIMETHICONE 80 MG PO CHEW: 80.0000 mg | CHEWABLE_TABLET | ORAL | Status: DC | PRN

## 2013-09-22 MED ORDER — FENTANYL 2.5 MCG/ML BUPIVACAINE 1/10 % EPIDURAL INFUSION (WH - ANES)
INTRAMUSCULAR | Status: DC | PRN
  Administered 2013-09-22: 14 mL/h via EPIDURAL

## 2013-09-22 MED ORDER — SENNOSIDES-DOCUSATE SODIUM 8.6-50 MG PO TABS
2.0000 | ORAL_TABLET | ORAL | Status: DC
  Administered 2013-09-22: 2 via ORAL
  Filled 2013-09-22 (×2): qty 2

## 2013-09-22 MED ORDER — ONDANSETRON HCL 4 MG/2ML IJ SOLN: 4.0000 mg | INTRAMUSCULAR | Status: DC | PRN

## 2013-09-22 NOTE — Progress Notes (Signed)
Kenishia Alethia BertholdLeticia Carman Chingonce Melendez is a 23 y.o. G1P0000 at 7335w3d admitted for IOL for postdates  Subjective: Pushing for past 15 min, doing well, s/p epidural.  Objective: BP 114/80  Pulse 101  Temp(Src) 99.9 F (37.7 C) (Oral)  Resp 20  Ht 5\' 2"  (1.575 m)  Wt 74.844 kg (165 lb)  BMI 30.17 kg/m2  SpO2 96% I/O last 3 completed shifts: In: -  Out: 1150 [Urine:1150]    FHT:  Baseline 150s, +accels (15x15), moderate variability, no decels. UC:  Regular q 2-3 min SVE:   Dilation: 10 Effacement (%): 90 Station: 0 Exam by:: hk Clear fluid Labs: Lab Results  Component Value Date   WBC 12.7* 09/21/2013   HGB 9.9* 09/21/2013   HCT 30.3* 09/21/2013   MCV 64.9* 09/21/2013   PLT 267 09/21/2013    Assessment / Plan: Nanda QuintonFlor [name] is a 23 y.o. G1P0000 at 6535w3d admitted for IOL for postdates.  Labor: Progressing adequately on Pitocin. Currently 10cm and actively pushing. Continue monitor Preeclampsia:  NA Fetal Wellbeing:  Category I tracing Pain Control:  Epidural I/D:  GBS negative Anticipated MOD:  NSVD  Saralyn PilarAlexander Karamalegos, DO [facility], PGY-1 09/22/2013, 1:28 PM

## 2013-09-22 NOTE — Progress Notes (Signed)
I have seen and examined this patient and I agree with the above. Connie MerlesKimberly D Shaw 6:57 AM 09/22/2013

## 2013-09-22 NOTE — Anesthesia Preprocedure Evaluation (Signed)

## 2013-09-22 NOTE — Progress Notes (Signed)
I have seen and examined this patient and I agree with the above. Arabella MerlesKimberly D Briley Bumgarner 4:57 AM 09/22/2013

## 2013-09-22 NOTE — Progress Notes (Signed)
Connie Alethia BertholdLeticia Carman Chingonce Ferguson is a 23 y.o. G1P0000 at 8018w3d   Subjective: Resting comfortably after dose of IV Fentanyl.  Objective: BP 115/77  Pulse 85  Temp(Src) 98.6 F (37 C) (Oral)  Resp 20  Ht 5\' 2"  (1.575 m)  Wt 74.844 kg (165 lb)  BMI 30.17 kg/m2      FHT:  Baseline 140s,, +accels, moderate varibaility, no decels, category I tracing UC:   every 1-3 minutes SVE:   No exam performed at this time  Labs: Lab Results  Component Value Date   WBC 12.7* 09/21/2013   HGB 9.9* 09/21/2013   HCT 30.3* 09/21/2013   MCV 64.9* 09/21/2013   PLT 267 09/21/2013    Assessment / Plan: Connie Ferguson is a 23 y.o. G1P0000 at 7718w3d by LMP/US admitted for IOL postdates   Labor: Currently on 8mU of Pitocin, continue to increase per protocol  Preeclampsia: n/a  Fetal Wellbeing: Category I  Pain Control: Fentanyl IV PRN. Planning epidural  I/D: GBS (negative)  Anticipated MOD: NSVD  Connie Ferguson 09/22/2013, 2:07 AM

## 2013-09-22 NOTE — Progress Notes (Signed)
Nylee Alethia BertholdLeticia Carman Chingonce Melendez is a 23 y.o. G1P0000 at 247w3d admitted for IOL for postdates  Subjective: Pt is feeling more comfortable s/p epidural.  No complaints.  Objective: BP 103/58  Pulse 92  Temp(Src) 99 F (37.2 C) (Oral)  Resp 18  Ht 5\' 2"  (1.575 m)  Wt 74.844 kg (165 lb)  BMI 30.17 kg/m2  SpO2 96%      FHT:  Baseline 140s, + accels, moderate variability, no decels. Category I tracing UC:  Every 1-3 minutes SVE:   Dilation: 7 Effacement (%): 90 Station: -1 Exam by:: Kallon Caylor MD  Labs: Lab Results  Component Value Date   WBC 12.7* 09/21/2013   HGB 9.9* 09/21/2013   HCT 30.3* 09/21/2013   MCV 64.9* 09/21/2013   PLT 267 09/21/2013    Assessment / Plan: Nanda QuintonFlor [name] is a 23 y.o. G1P0000 at 257w3d admitted for IOL for postdates.  Labor: Progressing adequately, currently on 10Mu of pitocin Preeclampsia:  NA Fetal Wellbeing:  Category I tracing Pain Control:  Epidural I/D:  GBS negative Anticipated MOD:  NSVD  Nava Song H Ivannia Willhelm 09/22/2013, 5:09 AM

## 2013-09-22 NOTE — Progress Notes (Signed)
Connie Alethia BertholdLeticia Carman Chingonce Ferguson is a 23 y.o. G1P0000 at 5572w3d admitted for IOL for postdates  Subjective: No complaints.  Feeling some contractions but comfortable. Ruptured membranes this check  Objective: BP 99/58  Pulse 96  Temp(Src) 99.1 F (37.3 C) (Oral)  Resp 20  Ht 5\' 2"  (1.575 m)  Wt 74.844 kg (165 lb)  BMI 30.17 kg/m2  SpO2 96% I/O last 3 completed shifts: In: -  Out: 1150 [Urine:1150]    FHT:  Baseline 150s, + accels, moderate variability, no decels. Category I tracing UC:  Every 2-3 minutes SVE:   Dilation: 8 Effacement (%): 90 Station: -1 Exam by:: hk Clear fluid Labs: Lab Results  Component Value Date   WBC 12.7* 09/21/2013   HGB 9.9* 09/21/2013   HCT 30.3* 09/21/2013   MCV 64.9* 09/21/2013   PLT 267 09/21/2013    Assessment / Plan: Connie Ferguson Connie Ferguson is a 23 y.o. G1P0000 at 4872w3d admitted for IOL for postdates.  Labor: Progressing adequately on Pitocin, no change since last check. Membranes ruptured clear fluid Preeclampsia:  NA Fetal Wellbeing:  Category I tracing Pain Control:  Epidural I/D:  GBS negative Anticipated MOD:  NSVD  Connie BalsamMichael R Chandani Ferguson 09/22/2013, 9:13 AM

## 2013-09-22 NOTE — Progress Notes (Signed)
Connie Alethia BertholdLeticia Carman Chingonce Ferguson is a 23 y.o. G1P0000 at 5077w3d admitted for IOL for postdates  Subjective: No complaints.  Feeling some contractions but comfortable.  Objective: BP 105/68  Pulse 94  Temp(Src) 99.6 F (37.6 C) (Oral)  Resp 16  Ht 5\' 2"  (1.575 m)  Wt 74.844 kg (165 lb)  BMI 30.17 kg/m2  SpO2 96% I/O last 3 completed shifts: In: -  Out: 1150 [Urine:1150]    FHT:  Baseline 150s, + accels, moderate variability, no decels. Category I tracing UC:  Every 1-2 minutes SVE:   Dilation: 8 Effacement (%): 90 Station: -1 Exam by:: Eveleigh Crumpler MD  Labs: Lab Results  Component Value Date   WBC 12.7* 09/21/2013   HGB 9.9* 09/21/2013   HCT 30.3* 09/21/2013   MCV 64.9* 09/21/2013   PLT 267 09/21/2013    Assessment / Plan: Nanda QuintonFlor Connie Ferguson is a 23 y.o. G1P0000 at 977w3d admitted for IOL for postdates.  Labor: Progressing adequately on Pitocin, membranes intact Preeclampsia:  NA Fetal Wellbeing:  Category I tracing Pain Control:  Epidural I/D:  GBS negative Anticipated MOD:  NSVD  Kirk Sampley H Meer Reindl 09/22/2013, 7:27 AM

## 2013-09-22 NOTE — Anesthesia Procedure Notes (Signed)
Epidural Patient location during procedure: OB  Staffing Anesthesiologist: [place] Performed by: anesthesiologist   Preanesthetic Checklist Completed: patient identified, site marked, surgical consent, pre-op evaluation, timeout performed, IV checked, risks and benefits discussed and monitors and equipment checked  Epidural Patient position: sitting Prep: ChloraPrep Patient monitoring: heart rate, continuous pulse ox and blood pressure Approach: right paramedian Location: L3-L4 Injection technique: LOR saline  Needle:  Needle type: Tuohy  Needle gauge: 17 G Needle length: 9 cm and 9 Needle insertion depth: 5 cm Catheter type: closed end flexible Catheter size: 20 Guage Catheter at skin depth: 10 cm Test dose: negative  Assessment Events: blood not aspirated, injection not painful, no injection resistance, negative IV test and no paresthesia  Additional Notes   Patient tolerated the insertion well without complications.   

## 2013-09-23 LAB — CBC
HCT: 25.4 % — ABNORMAL LOW (ref 36.0–46.0)
Hemoglobin: 8.2 g/dL — ABNORMAL LOW (ref 12.0–15.0)
MCH: 20.9 pg — ABNORMAL LOW (ref 26.0–34.0)
MCHC: 32.3 g/dL (ref 30.0–36.0)
MCV: 64.6 fL — AB (ref 78.0–100.0)
PLATELETS: 219 10*3/uL (ref 150–400)
RBC: 3.93 MIL/uL (ref 3.87–5.11)
RDW: 15.8 % — AB (ref 11.5–15.5)
WBC: 23.1 10*3/uL — ABNORMAL HIGH (ref 4.0–10.5)

## 2013-09-23 MED ORDER — IBUPROFEN 600 MG PO TABS: 600.0000 mg | ORAL_TABLET | Freq: Four times a day (QID) | ORAL | Status: DC

## 2013-09-23 NOTE — Discharge Instructions (Signed)
Lactancia materna (Breastfeeding) Decidir Museum/gallery exhibitions officeramamantar es una de las mejores elecciones que puede hacer por usted y su beb. El cambio hormonal durante el Psychiatristembarazo produce el desarrollo del tejido mamario y Lesothoaumenta la cantidad y el tamao de los conductos galactforos. Estas hormonas tambin permiten que las protenas, los azcares y las grasas de la sangre produzcan la WPS Resourcesleche materna en las glndulas productoras de Round Mountainleche. Las hormonas impiden que la leche materna sea liberada antes del nacimiento del beb, adems de impulsar el flujo de leche luego del nacimiento. Una vez que ha comenzado a Museum/gallery exhibitions officeramamantar, Conservation officer, naturepensar en el beb, as Immunologistcomo la succin o Theatre managerel llanto, pueden estimular la liberacin de [place] Pointleche de las glndulas productoras de [place]leche.  LOS BENEFICIOS DE AMAMANTAR Para el beb  La primera leche (calostro) ayuda al mejor funcionamiento del sistema digestivo del beb.  La leche tiene anticuerpos que ayudan a Radio producerprevenir las infecciones en el beb.  El beb tiene una menor incidencia de asma, alergias y del sndrome de muerte sbita del lactante.  Los nutrientes en la [place]leche materna son mejores para el beb que la Mansfield Centerleche maternizada y estn preparados exclusivamente para cubrir las necesidades del beb.  La leche materna mejora el desarrollo cerebral del beb.  Es menos probable que el beb desarrolle otras enfermedades, como obesidad infantil, asma o diabetes mellitus de tipo 2. Para usted   La lactancia materna favorece el desarrollo de un vnculo muy especial entre la madre y el beb.  Es conveniente. Siempre est disponible a la temperatura correcta y es Friersoneconmica.  La lactancia materna ayuda a quemar caloras y a perder el peso ganado durante el Cedar Flatembarazo.  Favorece la contraccin del tero al tamao que tena antes del embarazo de manera ms rpida y disminuye el sangrado (loquios) despus del parto.  La lactancia materna contribuye a reducir Nurse, adultel riesgo de desarrollar diabetes mellitus de tipo 2,  osteoporosis o cncer de mama o de ovario en el futuro. SIGNOS DE QUE EL BEB EST HAMBRIENTO Primeros signos de [address]re  Aumenta su estado de Lesothoalerta o actividad.  Se estira.  Mueve la cabeza de un lado a otro.  Mueve la cabeza y abre la boca cuando se le toca la mejilla o la comisura de la boca (reflejo de bsqueda).  Aumenta las vocalizaciones, tales como sonidos de succin, se relame los labios, emite arrullos, suspiros, o chirridos.  Mueve la Jones Apparel Groupmano hacia la boca.  Se chupa con ganas los dedos o las manos. Signos tardos de Fisher Scientifichambre  Est agitado.  Llora de manera intermitente. Signos de AES Corporationhambre extrema Los signos de hambre extrema requerirn que lo calme y lo consuele antes de que el beb pueda alimentarse adecuadamente. No espere a que se manifiesten los siguientes signos de hambre extrema para comenzar a Museum/gallery exhibitions officeramamantar:   Designer, jewelleryAgitacin.  Llanto intenso y fuerte.   Gritos. INFORMACIN BSICA SOBRE LA LACTANCIA MATERNA Iniciacin de la lactancia materna  Encuentre un lugar cmodo para sentarse o acostarse, con un buen respaldo para el cuello y la espalda.  Coloque una almohada o una manta enrollada debajo del beb para acomodarlo a la altura de la mama (si est sentada). Las almohadas para Museum/gallery exhibitions officeramamantar se han diseado especialmente a fin de servir de apoyo para los brazos y el beb Smithfield Foodsmientras amamanta.  Asegrese de que el abdomen del beb est frente al suyo.  Masajee suavemente la mama. Con las yemas de los dedos, masajee la pared del pecho hacia el pezn en un movimiento circular. Esto estimula el flujo  de [place]onkleche. Es posible que Engineer, manufacturing systemsdeba continuar este movimiento mientras amamanta si la leche fluye lentamente.  Sostenga la mama con el pulgar por arriba del pezn y los otros 4 dedos por debajo de la mama. Asegrese de que los dedos se encuentren lejos del pezn y de la boca del beb.  Empuje suavemente los labios del beb con el pezn o con el dedo.  Cuando la boca del beb se abra lo  suficiente, acrquelo rpidamente a la mama e introduzca todo el pezn y la zona oscura que lo rodea (areola), tanto como sea posible, dentro de la boca del beb.  Debe haber ms areola visible por arriba del labio superior del beb que por debajo del labio inferior.  La lengua del beb debe estar entre la enca inferior y la Mentormama.  Asegrese de que la boca del beb est en la posicin correcta alrededor del pezn (prendida). Los labios del beb deben crear un sello sobre la mama, doblndose hacia afuera (invertidos).  Es comn que el beb succione durante 2 a 3 minutos para que comience el flujo de Atwoodleche materna. Cmo debe prenderse Es muy importante que le ensee al beb cmo prenderse adecuadamente a la mama. Si el beb no se prende adecuadamente, puede causarle dolor en el pezn y reducir la produccin de Kodi[place] Stationleche materna, y hacer que el beb tenga un escaso aumento de Tanainapeso. Adems, si el beb no se prende adecuadamente al pezn, puede tragar aire durante la alimentacin. Esto puede causarle molestias al beb. Hacer eructar al beb al Pilar Platecambiar de mama puede ayudarlo a liberar el aire. Sin embargo, ensearle al beb cmo prenderse a la mama adecuadamente es la mejor manera de evitar que se sienta molesto por tragar Oceanographeraire mientras se alimenta. Signos de que el beb se ha prendido adecuadamente al pezn:   Payton Doughtyironea o succiona de modo silencioso, sin causarle dolor.  Se escucha que traga cada 3 o 4 succiones.   Hay movimientos musculares por arriba y por delante de sus odos al Printmakersuccionar. Signos de que el beb no se ha prendido Audiological scientistadecuadamente al pezn:   Hace ruidos de succin o de chasquido mientras se alimenta.  Dolor en el pezn. Si cree que el beb no se prendi correctamente, deslice el dedo en la comisura de la boca y Ameren Corporationcolquelo entre las encas del beb para interrumpir la succin. Intente comenzar a amamantar nuevamente. Signos de Fish farm managerlactancia materna exitosa Signos del beb:   Disminucin  gradual en el nmero de succiones o cese completo de la succin.  Se duerme.  Relaja el cuerpo.  Retiene una pequea cantidad de Troutmanleche en su boca.  Se desprende solo del pecho. Signos que presenta usted:  Las mamas han aumentado la firmeza, el peso y el tamao 1 a 3 horas despus de Museum/gallery exhibitions officeramamantar.  Estn ms blandas inmediatamente despus de amamantar.  Un aumento del volumen de Bridgeportleche, y tambin el cambio de su consistencia y color se producen hacia el quinto da de Tour managerlactancia materna.  Los pezones no duelen, ni estn agrietados ni sangran. Signos de que su beb recibe la cantidad de leche suficiente  Moja al menos 3 paales en 24 horas. La orina debe ser clara y de color amarillo plido a los 5 [address]  [address] de [place]ida.  Defeca al menos 3 veces en 24 horas a los [address]  [address] de [address]. La materia fecal debe ser blanda y Cape Canaveralamarillenta.  Defeca al menos 3 veces en 24 horas a los [address] das de [address]. La  materia fecal debe ser grumosa y Corozalamarillenta.  No registra una prdida de peso mayor del 10% del peso al nacer durante los primeros 3 [address]  [address] de [place]ida.  Aumenta de peso un promedio de 4 a 7onzas (120 a 210ml) por semana despus de los 4 [address]  [address] de vida.  Aumenta de Des Arcpeso, Cedar Valediariamente, de Echomanera consistente a Glass blower/designerpartir de los [address]  [address] de vida, sin Passenger transport managerregistrar prdida de peso despus de las 2 semanas de vida. Despus de alimentarse, es posible que el beb regurgite una pequea cantidad. Esto es frecuente. FRECUENCIA Y DURACIN DE LA LACTANCIA MATERNA El amamantamiento frecuente la ayudar a producir ms Azerbaijanleche y a Education officer, communityprevenir problemas de Engineer, miningdolor en los pezones e hinchazn en las Sierra [place]amas. Alimente al beb cuando muestre signos de hambre o si siente la necesidad de reducir la congestin de las Belvillemamas. Esto se denomina "lactancia a demanda". Evite el uso del chupete mientras trabaja para establecer la lactancia (las primeras 4 a 6 semanas despus del nacimiento del beb). Despus de este perodo, podr ofrecerle un chupete. Las  investigaciones demostraron que el uso del chupete durante el primer ao de vida del beb disminuye el riesgo de desarrollar el sndrome de muerte sbita del lactante (SMSL). Permita que el nio se alimente en cada mama todo lo que desee. Contine amamantando al beb hasta que haya terminado de alimentarse. Cuando el beb se desprende o se queda dormido mientras se est alimentando de la primera mama, ofrzcale la segunda. Debido a que, con frecuencia, los recin Sunoconacidos permanecen somnolientos las primeras semanas de vida, es posible que deba despertar a su beb para alimentarlo. Los horarios de Acupuncturistlactancia varan de un beb a otro. Sin embargo, las siguientes reglas pueden servir como gua para ayudarle a Lawyergarantizar que el beb se alimenta adecuadamente:  Se puede amamantar a los recin nacidos (bebs de 4 semanas o menos de vida) cada 1 a 3 horas.  No deben transcurrir ms de 3 horas durante el da o 5 horas durante la noche sin que se amamante a los recin nacidos.  Debe amamantar al beb 8 veces como mnimo, en un perodo de 24 horas, hasta que comience a introducir slidos en su dieta, a los 6 meses de vida aproximadamente. EXTRACCIN DE Dean Foods CompanyLECHE MATERNA La extraccin y Contractorel almacenamiento de la leche materna le permiten asegurarse de que el beb se alimente exclusivamente de Lawtonleche materna, aun en momentos en los que no puede amamantar. Esto tiene especial importancia si debe regresar al Aleen Campitrabajo en el perodo en que an est amamantando o si no puede estar presente en los momentos en que el beb debe alimentarse. Su asesor en lactancia puede orientarla sobre cunto tiempo es seguro almacenar Bronsonleche materna.  El sacaleche es un aparato que le permite extraer leche de la mama a un recipiente estril. Luego, la leche materna extrada puede almacenarse en un refrigerador o freezer. Algunos sacaleches son Birdie Riddlemanuales, Delaney Meigsmientras que otros son elctricos. Consulte a su asesor en lactancia qu tipo ser ms conveniente  para usted. Los sacaleches se pueden comprar, sin embargo, algunos hospitales y grupos de apoyo a la lactancia materna alquilan Sports coachsacaleches mensualmente. Un asesor en lactancia puede ensearle cmo extraer W. R. Berkleyleche materna manualmente, en caso de que prefiera no usar un sacaleche.  CMO CUIDAR LAS MAMAS DURANTE LA LACTANCIA MATERNA Los pezones se secan, agrietan y duelen durante la Tour managerlactancia materna. Las siguientes recomendaciones pueden ayudarle a Pharmacologistmantener las TEPPCO Partnersmamas humectadas y sanas:  Careers information officervite usar jabn en los pezones.  Use un sostn  de soporte. Aunque no son esenciales, las camisetas sin mangas o los sostenes especiales para Museum/gallery exhibitions officeramamantar estn diseados para acceder fcilmente a las mamas, para Museum/gallery exhibitions officeramamantar sin tener que quitarse todo el sostn o la camiseta. Evite usar sostenes con aro o sostenes muy ajustados.  Seque al aire sus pezones durante 3 a 4minutos despus de amamantar al beb.  Utilice solo apsitos de Haematologistalgodn en el sostn para Environmental health practitionerabsorber las prdidas de [place] Heightsleche. La prdida de un poco de Public Service Enterprise Groupleche materna entre las tomas es normal.  Utilice lanolina sobre los pezones luego de Museum/gallery exhibitions officeramamantar. La lanolina ayuda a mantener la humedad normal de la piel. Si Botswanausa lanolina pura, no tiene que lavarse los pezones antes de Corporate treasureralimentar al beb. La lanolina pura no es txica para el beb. Adems, puede extraer Beazer Homesmanualmente algunas gotas de Buncombeleche materna y Engineer, maintenance (IT)masajear suavemente esa Winn-Dixieleche sobre los pezones, para que la Clymanleche se seque al aire. Durante las primeras semanas despus de dar a luz, algunas mujeres pueden experimentar hinchazn en las mamas (congestin Keedysvillemamaria). La congestin puede hacer que sienta las mamas pesadas, calientes y sensibles al tacto. El pico de la congestin ocurre dentro de los 3 a 5 das despus del Rush Springsparto. Las siguientes recomendaciones pueden ayudarle a Paramedicaliviar la congestin:  Vace por completo las mamas al QUALCOMMamamantar o Environmental health practitionerextraer leche. Puede aplicar calor hmedo en las mamas (en la ducha o con toallas  hmedas para manos) antes de Museum/gallery exhibitions officeramamantar o extraer WPS Resourcesleche. Esto aumenta la circulacin y Saint Vincent and the Grenadinesayuda a que la Swan Lakeleche fluya. Si el beb no vaca por completo las [address]amas cuando lo [address] Aveamamanta, extraiga la Swantonleche restante despus de que haya finalizado.  Use un sostn ajustado (para amamantar o comn) o camiseta sin mangas durante 1 o 2 das para indicar al cuerpo que disminuya ligeramente la produccin de Keyesleche.  Aplique compresas de hielo Yahoo! Incsobre las mamas, a menos que le resulte demasiado incmodo.  Asegrese de que el beb se encuentre en la posicin correcta mientras lo alimenta. Si la congestin persiste luego de 48 horas o despus de seguir estas recomendaciones, comunquese con su mdico o un Holiday representativeasesor en lactancia. RECOMENDACIONES GENERALES PARA EL CUIDADO DE LA SALUD DURANTE LA LACTANCIA MATERNA  Consuma alimentos saludables. Alterne comidas y colaciones, comiendo 3 de cada Agricultural engineeruna por da. Dado que lo que come Danaher Corporationafecta la leche materna, es posible que algunas comidas hagan que su beb se vuelva ms irritable de lo habitual. Evite comer este tipo de alimentos, si percibe que afectan de manera negativa al beb.  Beba leche, jugos de fruta y agua para Patent examinersatisfacer su sed (aproximadamente 10 vasos al Futures traderda).  Descanse con frecuencia, reljese y tome sus vitaminas prenatales para evitar la fatiga, el estrs y la anemia.  Contine con los autocontroles de la mama.  Evite masticar y fumar tabaco.  Evite el consumo de alcohol y drogas. Algunos medicamentos, que pueden ser perjudiciales para el beb, pueden pasar a travs de la Colgate Palmoliveleche materna. Es importante que consulte a su mdico antes de Medical sales representativetomar cualquier medicamento, incluidos todos los medicamentos recetados y de McComb Chapelventa libre, as como los suplementos vitamnicos y herbales. Puede quedar embarazada durante la lactancia. Si desea controlar la natalidad, consulte a su mdico cules son las opciones ms seguras para el beb. SOLICITE ATENCIN MDICA SI:   Usted siente que  quiere dejar de Museum/gallery exhibitions officeramamantar o se siente frustrada con la lactancia.  Siente dolor en las mamas o en los pezones.  Sus pezones estn agrietados o Water quality scientistsangran.  Sus pechos estn irritados,  sensibles o calientes.  Tiene un rea hinchada en cualquiera de las mamas.  Siente escalofros o fiebre.  Tiene nuseas o vmitos.  Presenta una secrecin de otro lquido distinto de la leche materna de los pezones.  Sus mamas no se llenan antes de Museum/gallery exhibitions officer al beb para el 5. da despus del parto.  Se siente triste y deprimida.  El beb est demasiado somnoliento como para comer bien.  El beb tiene problemas para dormir.  Moja menos de 3 paales en 24 horas.  Defeca menos de 3 veces en 24 horas.  La piel del beb o la parte blanca de sus ojos est amarilla.  El beb no ha aumentado de [place] a los [address] de [place]. SOLICITE ATENCIN MDICA DE INMEDIATO SI:   El beb est muy cansado (aletargado) y no se despierta para comer.  Le sube la fiebre sin causa. Document Released: 05/05/2005 Document Revised: 08/30/2012 [facility] Patient Information 2014 [place], [place]. Cuidados en el postparto luego de un parto vaginal  (Postpartum Care After Vaginal Delivery) Despus del parto (perodo de postparto), la estada normal en el hospital es de 24-72 horas. Si hubo problemas con el trabajo de parto o el parto, o si tiene otros problemas mdicos, es posible que Hydrologist en el hospital por ms [place].  Mientras est en el hospital, recibir [place] e instrucciones sobre cmo cuidar de usted misma y de su beb recin nacido durante el postparto.  Mientras est en el hospital:   Asegrese de decirle a las enfermeras si siente dolor o Dentist, as como donde Medical laboratory scientific officer y Training and development officer.  Si usted tuvo una incisin cerca de la vagina (episiotoma) o si ha tenido Armed forces technical officer, las enfermeras le pondrn hielo sobre la episiotoma o Art therapist. Las bolsas de hielo pueden ayudar  a Glass blower/designer y la hinchazn.  Si est amamantando, puede sentir contracciones dolorosas en el tero durante algunas semanas. Esto es normal. Las contracciones ayudan a que el tero vuelva a su tamao normal.  Es normal tener algo de sangrado despus del [place].  Durante los primeros 1-3 das despus del parto, el flujo es de color rojo y la cantidad puede ser similar a un perodo.  Es frecuente que el flujo se inicie y se Chief Strategy Officer.  En los primeros [place], puede eliminar algunos cogulos pequeos. Informe a las enfermeras si elimina cogulos grandes o aumenta el flujo.  No  elimine los cogulos de sangre por el inodoro antes de que la Johnson Controls vea.  Durante los prximos 3 a [address] despus del parto, el flujo debe ser ms acuoso y rosado o Child psychotherapist.  [place] a catorce American International Group del parto, el flujo debe ser una pequea cantidad de secrecin de color blanco amarillento.  La cantidad de flujo disminuir en las primeras semanas despus del parto. El flujo puede detenerse en 6-8 semanas. La mayora de las mujeres no tienen ms flujo a las 12 semanas despus del [place].  Usted debe cambiar sus apsitos con frecuencia.  Lvese bien las manos con agua y jabn durante al menos 20 segundos despus de cambiar el apsito, usar el bao o antes de Occupational psychologist o Corporate treasurer a su recin nacido.  Usted podr sentir como que tiene que vaciar la vejiga durante las primeras 6-8 horas despus del [place].  En caso de que sienta debilidad, mareo o [place], llame a la enfermera antes de levantarse de la cama por primera vez y antes  de tomar una ducha por primera vez.  Dentro de los Allstateprimeros das despus del parto, sus mamas pueden comenzar a estar sensibles y Doranllenas. Esto se llama congestin. La sensibilidad en los senos por lo general desaparece dentro de las 48-72 horas despus de que ocurre la congestin. Tambin puede notar que la Lipscombleche se escapa de sus senos. Si no est amamantando no estimule sus pechos.  La estimulacin de las mamas hace que sus senos produzcan ms Keyesportleche.  Pasar tanto tiempo como le sea posible con el beb recin nacido es muy importante. Durante ese tiempo, usted y su beb deben sentirse cerca y conocerse uno al otro. Tener al beb en su habitacin (alojamiento conjunto) ayudar a fortalecer el vnculo con el beb recin nacido.Esto le dar tiempo para conocerlo y atenderlo de Lewistonmanera cmoda.  Las hormonas se modifican despus del parto. A veces, los cambios hormonales pueden causar tristeza o ganas de llorar por un tiempo. Estos sentimientos no deben durar ms de Hughes Supplyunos pocos das. Si duran ms que eso, debe hablar con su mdico.  Si lo desea, hable con su mdico acerca de los mtodos de planificacin familiar o mtodos anticonceptivos.  Hable con su mdico acerca de las vacunas. El mdico puede indicarle que se aplique las siguientes vacunas antes de salir del hospital:  Sao Tome and PrincipeVacuna contra el ttanos, la difteria y la tos ferina (Tdap) o el ttanos y la difteria (Td). Es muy importante que usted y su familia (incluyendo a los abuelos) u otras personas que cuidan al recin nacido estn al da con las vacunas Tdap o Td. Las vacunas Tdap o Td pueden ayudar a proteger al recin nacido de enfermedades.  Inmunizacin contra la rubola.  Inmunizacin contra la varicela.  Inmunizacin contra la gripe. Usted debe recibir esta vacunacin anual si no la ha recibido Academic librariandurante el embarazo. Document Released: 03/02/2007 Document Revised: 01/28/2012 [facility] Patient Information 2014 [employer], [place].

## 2013-09-23 NOTE — Anesthesia Postprocedure Evaluation (Signed)
  Anesthesia Post-op Note  Anesthesia Post Note  Patient: Connie Ferguson  Procedure(s) Performed: * No procedures listed *  Anesthesia type: Epidural  Patient location: Mother/Baby  Post pain: Pain level controlled  Post assessment: Post-op Vital signs reviewed  Last Vitals:  Filed Vitals:   09/23/13 0515  BP: 101/69  Pulse: 89  Temp: 36.8 C  Resp: 18    Post vital signs: Reviewed  Level of consciousness:alert  Complications: No apparent anesthesia complications

## 2013-09-23 NOTE — Discharge Summary (Signed)
Obstetric Discharge Summary Reason for Admission: induction of labor Prenatal Procedures: none Intrapartum Procedures: spontaneous vaginal delivery Postpartum Procedures: none Complications-Operative and Postpartum: second degree perineal laceration Hemoglobin  Date Value Ref Range Status  09/23/2013 8.2* 12.0 - 15.0 g/dL Final     DELTA CHECK NOTED     REPEATED TO VERIFY     HCT  Date Value Ref Range Status  09/23/2013 25.4* 36.0 - 46.0 % Final    Physical Exam:  General: alert and no distress CV: RRR, No MRG Lungs: CTA BL Lochia: appropriate Uterine Fundus: firm, below the umbilicus Incision: NA DVT Evaluation: No evidence of DVT seen on physical exam. Negative Homan's sign.  Discharge Diagnoses: Post-date pregnancy  Hospital Course: Connie Ferguson is a 23 y.o. female G1 now P1001 admitted with IUP at 6470w2d for IOL for postdates.  At 2:54 PM a viable female was delivered via Vaginal, Spontaneous Delivery (Presentation: Right Occiput Anterior). APGAR: 9, 9; weight: TBD.  Placenta status: Intact, Spontaneous. Cord: 3 vessels with the following complications: none  Anesthesia: Epidural  Episiotomy: None  Lacerations: 2nd degree perineal lac  Suture Repair: 3.0 monocryl  Est. Blood Loss (mL): 400cc  Mom to postpartum. Baby to Couplet care / Skin to Skin.  Upon arrival patient was complete and had been pushing well for >1 hour. She continued to push with good effort, and delivered a healthy baby boy, placed on maternal abdomen for oral suctioning, drying and stimulation. Delayed cord clamping performed and cut by FOB. Placenta delivered intact with 3V cord. Vaginal canal and perineum was inspected with 2nd degree perineal laceration with angled extension to laceration, repaired in the usual fashion with 2x 3.0 monocryl suture repair, hemostatic on completion. Pitocin was started and uterus massaged until bleeding slowed. Counts of sharps, instruments, and lap pads were  all correct.   Patient was stable during the postpartum period and was discharged home on PPD # 1. Breastfeeding.  Nexplanon versus Mirena for contraception.  Discharge Information: Date: 09/23/2013 Activity: pelvic rest Diet: routine Medications: Ibuprofen Condition: stable Instructions: refer to practice specific booklet Discharge to: home Follow-up Information   [facility] Schedule an appointment as soon as possible for a visit in 4 weeks. (Postpartum Follow-up Appointment)    Specialty:  Home Health Services   Contact information:   [facility] [address] [place] [address] [phone]       Newborn Data: Live born female  Birth Weight: 8 lb 5 oz (3770 g) APGAR: 9, 9  Home with mother.  Connie JacobsonRobyn H Ferguson 09/23/2013, 7:56 AM  I have seen and examined this patient and agree the above assessment. Connie CalicoFrances Ferguson 09/23/2013 8:05 AM

## 2013-09-24 DIAGNOSIS — D649 Anemia, unspecified: Secondary | ICD-10-CM

## 2013-09-24 DIAGNOSIS — O9902 Anemia complicating childbirth: Secondary | ICD-10-CM

## 2013-09-24 DIAGNOSIS — O480 Post-term pregnancy: Secondary | ICD-10-CM

## 2013-09-24 NOTE — Discharge Summary (Signed)
Obstetric Discharge Summary  Reason for Admission: induction of labor  Prenatal Procedures: none  Intrapartum Procedures: spontaneous vaginal delivery  Postpartum Procedures: none  Complications-Operative and Postpartum: second degree perineal laceration  Hemoglobin   Date  Value  Ref Range  Status   09/23/2013  8.2*  12.0 - 15.0 g/dL  Final   DELTA CHECK NOTED   REPEATED TO VERIFY      HCT   Date  Value  Ref Range  Status   09/23/2013  25.4*  36.0 - 46.0 %  Final    Physical Exam:  General: alert and no distress  CV: RRR, No MRG  Lungs: CTA BL  Lochia: appropriate  Uterine Fundus: firm, below the umbilicus  Incision: NA  DVT Evaluation: No evidence of DVT seen on physical exam.  Negative Homan's sign.   Discharge Diagnoses: Post-date pregnancy   Hospital Course:  Connie Ferguson is a 23 y.o. female G1 now P1001 admitted with IUP at 3522w2d for IOL for postdates.  At 2:54 PM a viable female was delivered via Vaginal, Spontaneous Delivery (Presentation: Right Occiput Anterior). APGAR: 9, 9; weight: TBD.  Placenta status: Intact, Spontaneous. Cord: 3 vessels with the following complications: none  Anesthesia: Epidural  Episiotomy: None  Lacerations: 2nd degree perineal lac  Suture Repair: 3.0 monocryl  Est. Blood Loss (mL): 400cc  Mom to postpartum. Baby to Couplet care / Skin to Skin.  Upon arrival patient was complete and had been pushing well for >1 hour. She continued to push with good effort, and delivered a healthy baby boy, placed on maternal abdomen for oral suctioning, drying and stimulation. Delayed cord clamping performed and cut by FOB. Placenta delivered intact with 3V cord. Vaginal canal and perineum was inspected with 2nd degree perineal laceration with angled extension to laceration, repaired in the usual fashion with 2x 3.0 monocryl suture repair, hemostatic on completion. Pitocin was started and uterus massaged until bleeding slowed. Counts of sharps,  instruments, and lap pads were all correct.  Patient was stable during the postpartum period and was discharged home on PPD # 2. Breastfeeding. Nexplanon versus Mirena for contraception.   Discharge Information:  Date: 09/24/2013  Activity: pelvic rest  Diet: routine  Medications: Ibuprofen  Condition: stable  Instructions: refer to practice specific booklet  Discharge to: home   Follow-up Information    [facility] Schedule an appointment as soon as possible for a visit in 4 weeks. (Postpartum Follow-up Appointment)    Specialty: Home Health Services    Contact information:    [facility]  [address]  [place] [address]  [phone]      Newborn Data:  Live born female  Birth Weight: 8 lb 5 oz (3770 g)  APGAR: 9, 9  Home with mother.   Myriam JacobsonRobyn H Ferguson 09/24/2013, 8:48 AM  I have seen and examined this patient and I agree with the above. Connie Ferguson CNM 1:31 AM 09/29/2013

## 2013-09-24 NOTE — Lactation Note (Signed)
This note was copied from the chart of Connie Ferguson. Lactation Consultation Note: Follow up visit with mom before DC. Mom sleeping but Dad reports that baby has been nursing well but was still hungry so they gave some formula. Encouraged to always breastfeed first to promote a good milk supply then give formula if baby is still hungry. No questions at present. Encouraged to call me if mom has questions when she wakes up,   Patient Name: Connie Ferguson ZOXWR'UToday's Date: 09/24/2013 Reason for consult: Follow-up assessment   Maternal Data Formula Feeding for Exclusion: Yes Reason for exclusion: Mother's choice to formula and breast feed on admission  Feeding    LATCH Score/Interventions                      Lactation Tools Discussed/Used     Consult Status Consult Status: Complete    Pamelia HoitDonna D Alene Bergerson 09/24/2013, 10:26 AM

## 2013-09-26 NOTE — Progress Notes (Signed)
Post discharge ur review completed. 

## 2013-09-26 NOTE — Discharge Summary (Signed)
Attestation of Attending Supervision of Advanced Practitioner (CNM/NP): Evaluation and management procedures were performed by the Advanced Practitioner under my supervision and collaboration.  I have reviewed the Advanced Practitioner's note and chart, and I agree with the management and plan.  Dawnmarie Breon 09/26/2013 7:37 AM

## 2014-03-20 ENCOUNTER — Encounter (HOSPITAL_COMMUNITY): Payer: Self-pay

## 2017-12-14 LAB — OB RESULTS CONSOLE RUBELLA ANTIBODY, IGM: Rubella: IMMUNE

## 2017-12-14 LAB — OB RESULTS CONSOLE HIV ANTIBODY (ROUTINE TESTING): HIV: NONREACTIVE

## 2017-12-14 LAB — OB RESULTS CONSOLE HEPATITIS B SURFACE ANTIGEN: Hepatitis B Surface Ag: NEGATIVE

## 2017-12-14 LAB — OB RESULTS CONSOLE GBS: STREP GROUP B AG: POSITIVE

## 2017-12-14 LAB — OB RESULTS CONSOLE RPR: RPR: NONREACTIVE

## 2017-12-16 ENCOUNTER — Other Ambulatory Visit (HOSPITAL_COMMUNITY): Payer: Self-pay | Admitting: Nurse Practitioner

## 2017-12-16 DIAGNOSIS — Z369 Encounter for antenatal screening, unspecified: Secondary | ICD-10-CM

## 2017-12-30 ENCOUNTER — Encounter (HOSPITAL_COMMUNITY): Payer: Self-pay

## 2018-01-04 ENCOUNTER — Encounter (HOSPITAL_COMMUNITY): Payer: Self-pay | Admitting: *Deleted

## 2018-01-06 ENCOUNTER — Ambulatory Visit (HOSPITAL_COMMUNITY)
Admission: RE | Admit: 2018-01-06 | Discharge: 2018-01-06 | Disposition: A | Discharge status: Home / Self Care | Payer: Self-pay | Source: Ambulatory Visit | Attending: Nurse Practitioner | Admitting: Nurse Practitioner

## 2018-01-06 ENCOUNTER — Ambulatory Visit (HOSPITAL_COMMUNITY): Payer: Self-pay

## 2018-01-06 ENCOUNTER — Encounter (HOSPITAL_COMMUNITY): Payer: Self-pay

## 2018-01-14 ENCOUNTER — Ambulatory Visit (HOSPITAL_COMMUNITY)
Admission: RE | Admit: 2018-01-14 | Discharge: 2018-01-14 | Disposition: A | Discharge status: Home / Self Care | Payer: Self-pay | Source: Ambulatory Visit | Attending: Family | Admitting: Family

## 2018-01-14 ENCOUNTER — Encounter (HOSPITAL_COMMUNITY): Payer: Self-pay

## 2018-01-14 DIAGNOSIS — Z3A12 12 weeks gestation of pregnancy: Secondary | ICD-10-CM | POA: Insufficient documentation

## 2018-01-14 DIAGNOSIS — Z3481 Encounter for supervision of other normal pregnancy, first trimester: Secondary | ICD-10-CM | POA: Insufficient documentation

## 2018-01-14 DIAGNOSIS — Z369 Encounter for antenatal screening, unspecified: Secondary | ICD-10-CM

## 2018-01-14 DIAGNOSIS — Z3682 Encounter for antenatal screening for nuchal translucency: Secondary | ICD-10-CM | POA: Insufficient documentation

## 2018-01-27 ENCOUNTER — Other Ambulatory Visit: Payer: Self-pay

## 2018-03-12 ENCOUNTER — Inpatient Hospital Stay (HOSPITAL_COMMUNITY)
Admission: AD | Admit: 2018-03-12 | Discharge: 2018-03-13 | Disposition: A | Discharge status: Home / Self Care | Payer: Self-pay | Source: Ambulatory Visit | Attending: Obstetrics & Gynecology | Admitting: Obstetrics & Gynecology

## 2018-03-12 ENCOUNTER — Encounter (HOSPITAL_COMMUNITY): Payer: Self-pay

## 2018-03-12 ENCOUNTER — Inpatient Hospital Stay (HOSPITAL_COMMUNITY): Payer: Self-pay

## 2018-03-12 DIAGNOSIS — R12 Heartburn: Secondary | ICD-10-CM

## 2018-03-12 DIAGNOSIS — O26892 Other specified pregnancy related conditions, second trimester: Secondary | ICD-10-CM

## 2018-03-12 DIAGNOSIS — R1011 Right upper quadrant pain: Secondary | ICD-10-CM | POA: Insufficient documentation

## 2018-03-12 DIAGNOSIS — O99612 Diseases of the digestive system complicating pregnancy, second trimester: Secondary | ICD-10-CM | POA: Insufficient documentation

## 2018-03-12 DIAGNOSIS — Z3A21 21 weeks gestation of pregnancy: Secondary | ICD-10-CM | POA: Insufficient documentation

## 2018-03-12 DIAGNOSIS — K219 Gastro-esophageal reflux disease without esophagitis: Secondary | ICD-10-CM | POA: Insufficient documentation

## 2018-03-12 LAB — COMPREHENSIVE METABOLIC PANEL
ALBUMIN: 3.3 g/dL — AB (ref 3.5–5.0)
ALT: 18 U/L (ref 0–44)
AST: 19 U/L (ref 15–41)
Alkaline Phosphatase: 77 U/L (ref 38–126)
Anion gap: 11 (ref 5–15)
BUN: 7 mg/dL (ref 6–20)
CHLORIDE: 102 mmol/L (ref 98–111)
CO2: 20 mmol/L — AB (ref 22–32)
CREATININE: 0.37 mg/dL — AB (ref 0.44–1.00)
Calcium: 8.9 mg/dL (ref 8.9–10.3)
GFR calc Af Amer: >60 mL/min (ref >60)
GLUCOSE: 104 mg/dL — AB (ref 70–99)
POTASSIUM: 3.3 mmol/L — AB (ref 3.5–5.1)
SODIUM: 133 mmol/L — AB (ref 135–145)
Total Bilirubin: 0.7 mg/dL (ref 0.3–1.2)
Total Protein: 6.9 g/dL (ref 6.5–8.1)

## 2018-03-12 LAB — URINALYSIS, ROUTINE W REFLEX MICROSCOPIC
Bilirubin Urine: NEGATIVE
Glucose, UA: NEGATIVE mg/dL
Hgb urine dipstick: NEGATIVE
Ketones, ur: NEGATIVE mg/dL
Leukocytes, UA: NEGATIVE
Nitrite: NEGATIVE
PH: 6 (ref 5.0–8.0)
Protein, ur: NEGATIVE mg/dL
SPECIFIC GRAVITY, URINE: 1.006 (ref 1.005–1.030)

## 2018-03-12 LAB — LIPASE, BLOOD: LIPASE: 24 U/L (ref 11–51)

## 2018-03-12 LAB — CBC
HCT: 27.4 % — ABNORMAL LOW (ref 36.0–46.0)
Hemoglobin: 9 g/dL — ABNORMAL LOW (ref 12.0–15.0)
MCH: 20.3 pg — ABNORMAL LOW (ref 26.0–34.0)
MCHC: 32.8 g/dL (ref 30.0–36.0)
MCV: 61.7 fL — AB (ref 80.0–100.0)
PLATELETS: 251 10*3/uL (ref 150–400)
RBC: 4.44 MIL/uL (ref 3.87–5.11)
RDW: 15.8 % — AB (ref 11.5–15.5)
WBC: 17.9 10*3/uL — AB (ref 4.0–10.5)
nRBC: 0 % (ref 0.0–0.2)

## 2018-03-12 MED ORDER — LIDOCAINE VISCOUS HCL 2 % MT SOLN: 15.0000 mL | Freq: Once | OROMUCOSAL | Status: DC

## 2018-03-12 MED ORDER — ALUM & MAG HYDROXIDE-SIMETH 200-200-20 MG/5ML PO SUSP: 30.0000 mL | Freq: Once | ORAL | Status: DC

## 2018-03-12 NOTE — MAU Note (Addendum)
Pain in upper abd that radiates to back since 1200. Never had this pain before. Denies n/v/d. Had drank OJ in morning but nothing to eat before pain started.Denies vag bleeding or d/c. Pain is collicky and constant. Pain is worse with inspiration

## 2018-03-12 NOTE — MAU Provider Note (Addendum)
Chief Complaint: Abdominal Pain   First Provider Initiated Contact with Patient 03/12/18 2227      SUBJECTIVE HPI: Connie Ferguson is a 27 y.o. G2P1001 at [redacted]w[redacted]d by LMP who presents to maternity admissions reporting onset of upper abdominal pain around noon. The pain is burning and sharp and across her upper abdomen under her ribs, but more on the right upper side. The pain does not radiate. It is the same in intensity since onset.  She had orange juice and a tortilla with cheese this morning a couple of hours before pain started, then tried to eat after the pain started, and had a meal but the pain worsened.  She has not tried any treatments.  There are no other associated symptoms.   She denies vaginal bleeding, vaginal itching/burning, urinary symptoms, h/a, dizziness, n/v, or fever/chills.     HPI  Past Medical History:  Diagnosis Date  . Anemia   . Trichomonas contact, treated    Past Surgical History:  Procedure Laterality Date  . NO PAST SURGERIES     Social History   Socioeconomic History  . Marital status: Significant Other    Spouse name: Not on file  . Number of children: Not on file  . Years of education: Not on file  . Highest education level: Not on file  Occupational History  . Not on file  Social Needs  . Financial resource strain: Not on file  . Food insecurity:    Worry: Not on file    Inability: Not on file  . Transportation needs:    Medical: Not on file    Non-medical: Not on file  Tobacco Use  . Smoking status: Never Smoker  . Smokeless tobacco: Never Used  Substance and Sexual Activity  . Alcohol use: No  . Drug use: No  . Sexual activity: Yes  Lifestyle  . Physical activity:    Days per week: Not on file    Minutes per session: Not on file  . Stress: Not on file  Relationships  . Social connections:    Talks on phone: Not on file    Gets together: Not on file    Attends religious service: Not on file    Active member of club  or organization: Not on file    Attends meetings of clubs or organizations: Not on file    Relationship status: Not on file  . Intimate partner violence:    Fear of current or ex partner: Not on file    Emotionally abused: Not on file    Physically abused: Not on file    Forced sexual activity: Not on file  Other Topics Concern  . Not on file  Social History Narrative  . Not on file   No current facility-administered medications on file prior to encounter.    Current Outpatient Medications on File Prior to Encounter  Medication Sig Dispense Refill  . Acetaminophen (TYLENOL 8 HOUR PO) Take by mouth.    . calcium carbonate (TUMS - DOSED IN MG ELEMENTAL CALCIUM) 500 MG chewable tablet Chew 1 tablet by mouth daily as needed for indigestion or heartburn.    . Prenatal Multivit-Min-Fe-FA (PRENATAL VITAMINS PO) Take by mouth.    . Prenatal Vit-Fe Fumarate-FA (PRENATAL MULTIVITAMIN) TABS tablet Take 1 tablet by mouth daily at 12 noon.     No Known Allergies  ROS:  Review of Systems  Constitutional: Negative for chills, fatigue and fever.  Eyes: Negative for visual disturbance.  Respiratory: Negative for shortness of breath.   Cardiovascular: Negative for chest pain.  Gastrointestinal: Positive for abdominal pain. Negative for nausea and vomiting.  Genitourinary: Negative for difficulty urinating, dysuria, flank pain, pelvic pain, vaginal bleeding, vaginal discharge and vaginal pain.  Neurological: Negative for dizziness and headaches.  Psychiatric/Behavioral: Negative.      I have reviewed patient's Past Medical Hx, Surgical Hx, Family Hx, Social Hx, medications and allergies.   Physical Exam   Patient Vitals for the past 24 hrs:  BP Temp Temp src Pulse Resp SpO2 Height Weight  03/13/18 0138 105/60 (!) 101.7 F (38.7 C) Oral (!) 128 - - - -  03/13/18 1610 105/60 - - - 19 - - -  03/13/18 9604 - - - - 19 - - -  03/12/18 2136 - - - - (!) 23 - - -  03/12/18 2049 121/68 99.8 F  (37.7 C) - (!) 108 (!) 32 100 % 5\' 3"  (1.6 m) 69.9 kg   Constitutional: Well-developed, well-nourished female in no acute distress.  Cardiovascular: normal rate Respiratory: normal effort GI: Abd soft, mild tenderness in RUQ, no rebound tenderness or guarding, Pos BS x 4 MS: Extremities nontender, no edema, normal ROM Neurologic: Alert and oriented x 4.  GU: Neg CVAT.  PELVIC EXAM: Deferred  FHT 158 by doppler  LAB RESULTS Results for orders placed or performed during the hospital encounter of 03/12/18 (from the past 24 hour(s))  Urinalysis, Routine w reflex microscopic     Status: Abnormal   Collection Time: 03/12/18  9:33 PM  Result Value Ref Range   Color, Urine STRAW (A) YELLOW   APPearance CLEAR CLEAR   Specific Gravity, Urine 1.006 1.005 - 1.030   pH 6.0 5.0 - 8.0   Glucose, UA NEGATIVE NEGATIVE mg/dL   Hgb urine dipstick NEGATIVE NEGATIVE   Bilirubin Urine NEGATIVE NEGATIVE   Ketones, ur NEGATIVE NEGATIVE mg/dL   Protein, ur NEGATIVE NEGATIVE mg/dL   Nitrite NEGATIVE NEGATIVE   Leukocytes, UA NEGATIVE NEGATIVE  CBC     Status: Abnormal   Collection Time: 03/12/18 10:55 PM  Result Value Ref Range   WBC 17.9 (H) 4.0 - 10.5 K/uL   RBC 4.44 3.87 - 5.11 MIL/uL   Hemoglobin 9.0 (L) 12.0 - 15.0 g/dL   HCT 54.0 (L) 98.1 - 19.1 %   MCV 61.7 (L) 80.0 - 100.0 fL   MCH 20.3 (L) 26.0 - 34.0 pg   MCHC 32.8 30.0 - 36.0 g/dL   RDW 47.8 (H) 29.5 - 62.1 %   Platelets 251 150 - 400 K/uL   nRBC 0.0 0.0 - 0.2 %  Comprehensive metabolic panel     Status: Abnormal   Collection Time: 03/12/18 10:55 PM  Result Value Ref Range   Sodium 133 (L) 135 - 145 mmol/L   Potassium 3.3 (L) 3.5 - 5.1 mmol/L   Chloride 102 98 - 111 mmol/L   CO2 20 (L) 22 - 32 mmol/L   Glucose, Bld 104 (H) 70 - 99 mg/dL   BUN 7 6 - 20 mg/dL   Creatinine, Ser 3.08 (L) 0.44 - 1.00 mg/dL   Calcium 8.9 8.9 - 65.7 mg/dL   Total Protein 6.9 6.5 - 8.1 g/dL   Albumin 3.3 (L) 3.5 - 5.0 g/dL   AST 19 15 - 41 U/L    ALT 18 0 - 44 U/L   Alkaline Phosphatase 77 38 - 126 U/L   Total Bilirubin 0.7 0.3 - 1.2 mg/dL   GFR  calc non Af Amer >60 >60 mL/min   GFR calc Af Amer >60 >60 mL/min   Anion gap 11 5 - 15  Lipase, blood     Status: None   Collection Time: 03/12/18 10:55 PM  Result Value Ref Range   Lipase 24 11 - 51 U/L       IMAGING US Abdomen Limited Ruq  Result Date: 03/12/2018 CLINICAL DATA:  27 year old pregnant female presenting with right upper quadrant abdominal pain. EXAM: ULTRASOUND ABDOMEN LIMITED RIGHT UPPER QUADRANT COMPARISON:  None. FINDINGS: Gallbladder: No gallstones or wall thickening visualized. No sonographic Murphy sign noted by sonographer. Common bile duct: Diameter: 3 mm Liver: No focal lesion identified. Within normal limits in parenchymal echogenicity. Portal vein is patent on color Doppler imaging with normal direction of blood flow towards the liver. IMPRESSION: Unremarkable right upper quadrant ultrasound. Electronically Signed   By: Elgie Collard M.D.   On: 03/12/2018 23:57    MAU Management/MDM: CBC, CMP, lipase.   Pt NPO x 6 hours upon arrival to MAU so RUQ [place] ordered CBC with WBCs 17.9 but no rebound tenderness or guarding. No vomiting reported. Unlikely appendicitis. RUQ [place] normal, without evidence of cholecystitis Pt given GI cocktail with significant improvement in pain With improvement in symptoms, pt D/C'd with Rx for Zantac BID PRN Precautions/reasons to return reviewed Spanish interpreter on language line used for all communication   ASSESSMENT 1. Gastroesophageal reflux disease without esophagitis   2. Colicky RUQ abdominal pain   3. Heartburn during pregnancy in second trimester     PLAN Discharge home   Addendum: Discharge vital signs by RN revealed fever of 101.7 and tachycardia at 130.  Pt feels well, continues to report pain is minimal since GI cocktail  Abdomen reexamined and remains nonsurgical and benign with less RUQ tenderness than on  original exam.  Consult Dr Despina Hidden.  May be GI virus. Ok to discharge with precautions of when to return. Tylenol, PO fluids for fever.  Allergies as of 03/13/2018   No Known Allergies     Medication List    STOP taking these medications   ibuprofen 600 MG tablet Commonly known as:  ADVIL,MOTRIN     TAKE these medications   calcium carbonate 500 MG chewable tablet Commonly known as:  TUMS - dosed in mg elemental calcium Chew 1 tablet by mouth daily as needed for indigestion or heartburn.   prenatal multivitamin Tabs tablet Take 1 tablet by mouth daily at 12 noon.   PRENATAL VITAMINS PO Take by mouth.   ranitidine 150 MG tablet Commonly known as:  ZANTAC Take 1 tablet (150 mg total) by mouth 2 (two) times daily as needed for heartburn.   TYLENOL 8 HOUR PO Take by mouth.      Follow-up Information    Department, [facility] Follow up.   Why:  Segn lo programado, regrese a MAU segn sea necesario para emergencias. Contact information: [address] [place] [address] [phone]           Sharen Counter Certified Nurse-Midwife 03/13/2018  3:37 AM

## 2018-03-13 ENCOUNTER — Other Ambulatory Visit: Payer: Self-pay | Admitting: Nurse Practitioner

## 2018-03-13 DIAGNOSIS — O26892 Other specified pregnancy related conditions, second trimester: Secondary | ICD-10-CM

## 2018-03-13 DIAGNOSIS — R12 Heartburn: Secondary | ICD-10-CM

## 2018-03-13 DIAGNOSIS — K219 Gastro-esophageal reflux disease without esophagitis: Secondary | ICD-10-CM

## 2018-03-13 MED ORDER — GI COCKTAIL ~~LOC~~
30.0000 mL | Freq: Once | ORAL | Status: AC
  Administered 2018-03-13: 30 mL via ORAL
  Filled 2018-03-13: qty 30

## 2018-03-13 MED ORDER — RANITIDINE HCL 150 MG PO TABS: 150.0000 mg | ORAL_TABLET | Freq: Two times a day (BID) | ORAL | 2 refills | Status: DC | PRN

## 2018-03-13 MED ORDER — CIMETIDINE 200 MG PO TABS: 200.0000 mg | ORAL_TABLET | Freq: Two times a day (BID) | ORAL | 1 refills | Status: DC

## 2018-03-13 MED ORDER — RANITIDINE HCL 150 MG PO TABS: 150.0000 mg | ORAL_TABLET | Freq: Two times a day (BID) | ORAL | 2 refills | Status: DC

## 2018-03-13 NOTE — Progress Notes (Signed)
Pharmacy send notice - Zantac on backorder.  Ordered Cimetidine 200 MG PO BID instead of Zantac.  Nolene Bernheim, RN, MSN, NP-BC Nurse Practitioner, [facility] for [employer], [facility] 03/13/2018 2:45 PM

## 2018-03-13 NOTE — MAU Note (Signed)
Upon discharging the patient, it was noted that the patient had a fever of 101.7 and HR 128. CNM notified of patient discharge vital signs. CNM in to evaluate the patient. Patient okay for discharge with strict return precautions.

## 2018-05-19 NOTE — L&D Delivery Note (Addendum)
Delivery Note At 4:38 PM a viable female was delivered via Vaginal, Spontaneous without augmentaion after 1 dose of intrapartum Ampicillin for GBS colonization which was inadequate (<4 hrs administration to delivery)  (Presentation:vertx ;OP  ). APGAR: 9, 9; weight: 3495 g  Placenta status: spontaneous, intact. Cord: 3 vessel and there was nuchal loop x1 reduced with somersault manuever.  Cord pH: none taken   Maternal pushing was prolonged with Infant in OP position, there was significant edema intrapartum to the frenulum of the labium and the vestibular fossa with some swelling of the posterior vaginal mucosa which then tore at the 7 0'clock position. The defect was anesthetized with 1% Lidocaine approx 30ml. Was repaired with 3-0 Monocryl suture using a running stitch from the apex for the vaginal wall toward the vulva and dead space was closed in the same fashion using deep bites. The perineum was repaired using subcuticular running suture and then anchored and tied within the vagina on the posterior wall. There was still a great deal of edema and Ice was applied as well as lidocaine 1% solution. Patient was instructed to do Sitz baths and also continue to ice to control the swelling.   BP 118/70   Pulse 67   Temp 98.3 F (36.8 C)   Resp 19   Ht 5' 2.21" (1.58 m)   Wt 77.6 kg   LMP 10/05/2017 (Exact Date)   SpO2 99%   Breastfeeding Unknown   BMI 31.07 kg/m   Anesthesia: None  Episiotomy: None Lacerations: 1st degree;Perineal Suture Repair: 3-0 monocryl  Est. Blood Loss (mL): 99 ml   Mom to postpartum.  Baby to Couplet care / Skin to Skin.  Sandi Raveling MD PGY 1 Family Medicine Resident [facility] 07/23/2018, 5:23 PM  OB FELLOW DELIVERY ATTESTATION  I was gloved and present for the delivery in its entirety, and I agree with the above resident's note.    Marcy Siren, D.O. OB Fellow  07/23/2018, 7:03 PM

## 2018-06-28 LAB — OB RESULTS CONSOLE GC/CHLAMYDIA
Chlamydia: NEGATIVE
Gonorrhea: NEGATIVE

## 2018-07-23 ENCOUNTER — Other Ambulatory Visit: Payer: Self-pay

## 2018-07-23 ENCOUNTER — Encounter (HOSPITAL_COMMUNITY): Payer: Self-pay | Admitting: *Deleted

## 2018-07-23 ENCOUNTER — Inpatient Hospital Stay (HOSPITAL_COMMUNITY)
Admission: AD | Admit: 2018-07-23 | Discharge: 2018-07-23 | Disposition: A | Discharge status: Home / Self Care | Payer: Medicaid Other | Source: Ambulatory Visit | Attending: Obstetrics & Gynecology | Admitting: Obstetrics & Gynecology

## 2018-07-23 ENCOUNTER — Inpatient Hospital Stay (HOSPITAL_COMMUNITY)
Admission: AD | Admit: 2018-07-23 | Discharge: 2018-07-25 | DRG: 807 | Disposition: A | Discharge status: Home / Self Care | Payer: Medicaid Other | Attending: Obstetrics and Gynecology | Admitting: Obstetrics and Gynecology

## 2018-07-23 DIAGNOSIS — O26893 Other specified pregnancy related conditions, third trimester: Secondary | ICD-10-CM | POA: Diagnosis present

## 2018-07-23 DIAGNOSIS — O98819 Other maternal infectious and parasitic diseases complicating pregnancy, unspecified trimester: Secondary | ICD-10-CM

## 2018-07-23 DIAGNOSIS — O99824 Streptococcus B carrier state complicating childbirth: Secondary | ICD-10-CM | POA: Diagnosis present

## 2018-07-23 DIAGNOSIS — O471 False labor at or after 37 completed weeks of gestation: Secondary | ICD-10-CM

## 2018-07-23 DIAGNOSIS — Z3A40 40 weeks gestation of pregnancy: Secondary | ICD-10-CM

## 2018-07-23 DIAGNOSIS — O1204 Gestational edema, complicating childbirth: Secondary | ICD-10-CM

## 2018-07-23 DIAGNOSIS — B951 Streptococcus, group B, as the cause of diseases classified elsewhere: Secondary | ICD-10-CM | POA: Diagnosis present

## 2018-07-23 DIAGNOSIS — Z9189 Other specified personal risk factors, not elsewhere classified: Secondary | ICD-10-CM

## 2018-07-23 LAB — CBC
HCT: 32.5 % — ABNORMAL LOW (ref 36.0–46.0)
Hemoglobin: 10 g/dL — ABNORMAL LOW (ref 12.0–15.0)
MCH: 20 pg — ABNORMAL LOW (ref 26.0–34.0)
MCHC: 30.8 g/dL (ref 30.0–36.0)
MCV: 64.9 fL — ABNORMAL LOW (ref 80.0–100.0)
PLATELETS: 248 10*3/uL (ref 150–400)
RBC: 5.01 MIL/uL (ref 3.87–5.11)
RDW: 16.1 % — ABNORMAL HIGH (ref 11.5–15.5)
WBC: 14.2 10*3/uL — ABNORMAL HIGH (ref 4.0–10.5)
nRBC: 0.1 % (ref 0.0–0.2)

## 2018-07-23 LAB — TYPE AND SCREEN
ABO/RH(D): O POS
Antibody Screen: NEGATIVE

## 2018-07-23 LAB — ABO/RH: ABO/RH(D): O POS

## 2018-07-23 MED ORDER — ACETAMINOPHEN 325 MG PO TABS: 650.0000 mg | ORAL_TABLET | ORAL | Status: DC | PRN

## 2018-07-23 MED ORDER — FENTANYL CITRATE (PF) 100 MCG/2ML IJ SOLN
100.0000 ug | INTRAMUSCULAR | Status: DC | PRN
  Administered 2018-07-23: 100 ug via INTRAVENOUS

## 2018-07-23 MED ORDER — DIBUCAINE 1 % RE OINT: 1.0000 "application " | TOPICAL_OINTMENT | RECTAL | Status: DC | PRN

## 2018-07-23 MED ORDER — PRENATAL MULTIVITAMIN CH
1.0000 | ORAL_TABLET | Freq: Every day | ORAL | Status: DC
  Administered 2018-07-24 – 2018-07-25 (×2): 1 via ORAL
  Filled 2018-07-23 (×2): qty 1

## 2018-07-23 MED ORDER — PRENATAL MULTIVITAMIN CH: 1.0000 | ORAL_TABLET | Freq: Every day | ORAL | Status: DC

## 2018-07-23 MED ORDER — ACETAMINOPHEN 325 MG PO TABS
650.0000 mg | ORAL_TABLET | ORAL | Status: DC | PRN
  Administered 2018-07-23: 650 mg via ORAL
  Filled 2018-07-23: qty 2

## 2018-07-23 MED ORDER — ONDANSETRON HCL 4 MG PO TABS: 4.0000 mg | ORAL_TABLET | ORAL | Status: DC | PRN

## 2018-07-23 MED ORDER — SODIUM CHLORIDE 0.9 % IV SOLN
2.0000 g | Freq: Four times a day (QID) | INTRAVENOUS | Status: DC
  Administered 2018-07-23: 2 g via INTRAVENOUS
  Filled 2018-07-23: qty 2000

## 2018-07-23 MED ORDER — ONDANSETRON HCL 4 MG/2ML IJ SOLN: 4.0000 mg | INTRAMUSCULAR | Status: DC | PRN

## 2018-07-23 MED ORDER — MEASLES, MUMPS & RUBELLA VAC IJ SOLR: 0.5000 mL | Freq: Once | INTRAMUSCULAR | Status: DC

## 2018-07-23 MED ORDER — LACTATED RINGERS IV SOLN: 500.0000 mL | INTRAVENOUS | Status: DC | PRN

## 2018-07-23 MED ORDER — OXYCODONE-ACETAMINOPHEN 5-325 MG PO TABS: 1.0000 | ORAL_TABLET | ORAL | Status: DC | PRN

## 2018-07-23 MED ORDER — FENTANYL CITRATE (PF) 100 MCG/2ML IJ SOLN
INTRAMUSCULAR | Status: AC
  Filled 2018-07-23: qty 2

## 2018-07-23 MED ORDER — TETANUS-DIPHTH-ACELL PERTUSSIS 5-2.5-18.5 LF-MCG/0.5 IM SUSP: 0.5000 mL | Freq: Once | INTRAMUSCULAR | Status: DC

## 2018-07-23 MED ORDER — ONDANSETRON HCL 4 MG/2ML IJ SOLN: 4.0000 mg | Freq: Four times a day (QID) | INTRAMUSCULAR | Status: DC | PRN

## 2018-07-23 MED ORDER — COCONUT OIL OIL: 1.0000 "application " | TOPICAL_OIL | Status: DC | PRN

## 2018-07-23 MED ORDER — ZOLPIDEM TARTRATE 5 MG PO TABS: 5.0000 mg | ORAL_TABLET | Freq: Every evening | ORAL | Status: DC | PRN

## 2018-07-23 MED ORDER — SOD CITRATE-CITRIC ACID 500-334 MG/5ML PO SOLN: 30.0000 mL | ORAL | Status: DC | PRN

## 2018-07-23 MED ORDER — FLEET ENEMA 7-19 GM/118ML RE ENEM: 1.0000 | ENEMA | RECTAL | Status: DC | PRN

## 2018-07-23 MED ORDER — LACTATED RINGERS IV SOLN
INTRAVENOUS | Status: DC
  Administered 2018-07-23: 15:00:00 via INTRAVENOUS

## 2018-07-23 MED ORDER — PROMETHAZINE HCL 25 MG/ML IJ SOLN
12.5000 mg | Freq: Four times a day (QID) | INTRAMUSCULAR | Status: DC | PRN
  Administered 2018-07-23: 12.5 mg via INTRAMUSCULAR
  Filled 2018-07-23: qty 1

## 2018-07-23 MED ORDER — LIDOCAINE HCL (PF) 1 % IJ SOLN
30.0000 mL | INTRAMUSCULAR | Status: AC | PRN
  Administered 2018-07-23: 30 mL via SUBCUTANEOUS
  Filled 2018-07-23: qty 30

## 2018-07-23 MED ORDER — MORPHINE SULFATE (PF) 4 MG/ML IV SOLN
4.0000 mg | Freq: Once | INTRAVENOUS | Status: AC
  Administered 2018-07-23: 4 mg via INTRAMUSCULAR
  Filled 2018-07-23: qty 1

## 2018-07-23 MED ORDER — OXYTOCIN 40 UNITS IN NORMAL SALINE INFUSION - SIMPLE MED
2.5000 [IU]/h | INTRAVENOUS | Status: DC
  Filled 2018-07-23: qty 1000

## 2018-07-23 MED ORDER — OXYCODONE-ACETAMINOPHEN 5-325 MG PO TABS: 2.0000 | ORAL_TABLET | ORAL | Status: DC | PRN

## 2018-07-23 MED ORDER — IBUPROFEN 600 MG PO TABS
600.0000 mg | ORAL_TABLET | Freq: Four times a day (QID) | ORAL | Status: DC
  Administered 2018-07-23 – 2018-07-25 (×7): 600 mg via ORAL
  Filled 2018-07-23 (×7): qty 1

## 2018-07-23 MED ORDER — DIPHENHYDRAMINE HCL 25 MG PO CAPS: 25.0000 mg | ORAL_CAPSULE | Freq: Four times a day (QID) | ORAL | Status: DC | PRN

## 2018-07-23 MED ORDER — WITCH HAZEL-GLYCERIN EX PADS: 1.0000 "application " | MEDICATED_PAD | CUTANEOUS | Status: DC | PRN

## 2018-07-23 MED ORDER — OXYTOCIN BOLUS FROM INFUSION
500.0000 mL | Freq: Once | INTRAVENOUS | Status: AC
  Administered 2018-07-23: 500 mL via INTRAVENOUS

## 2018-07-23 MED ORDER — SENNOSIDES-DOCUSATE SODIUM 8.6-50 MG PO TABS
2.0000 | ORAL_TABLET | ORAL | Status: DC
  Administered 2018-07-23 – 2018-07-24 (×2): 2 via ORAL
  Filled 2018-07-23 (×2): qty 2

## 2018-07-23 MED ORDER — BENZOCAINE-MENTHOL 20-0.5 % EX AERO
1.0000 "application " | INHALATION_SPRAY | CUTANEOUS | Status: DC | PRN
  Filled 2018-07-23: qty 56

## 2018-07-23 MED ORDER — SIMETHICONE 80 MG PO CHEW: 80.0000 mg | CHEWABLE_TABLET | ORAL | Status: DC | PRN

## 2018-07-23 NOTE — Discharge Instructions (Signed)
Contracciones de Braxton Hicks °Braxton Hicks Contractions °Las contracciones del útero pueden presentarse durante todo el embarazo, pero no siempre indican que la mujer está de parto. Es posible que usted haya tenido contracciones de práctica llamadas "contracciones de Braxton Hicks". A veces, se las confunde con el parto real. °¿Qué son las contracciones de Braxton Hicks? °Las contracciones de Braxton Hicks son espasmos que se producen en los músculos del útero antes del parto. A diferencia de las contracciones del parto verdadero, estas no producen el agrandamiento (la dilatación) ni el afinamiento del cuello uterino. Hacia el final del embarazo (entre las semanas 32 y 34), las contracciones de Braxton Hicks pueden presentarse más seguido y tornarse más intensas. A veces, resulta difícil distinguirlas del parto verdadero porque pueden ser muy molestas. No debe sentirse avergonzada si concurre al hospital con falso parto. °En ocasiones, la única forma de saber si el trabajo de parto es verdadero es que el médico determine si hay cambios en el cuello del útero. El médico le hará un examen físico y quizás le controle las contracciones. Si usted no está de parto verdadero, el examen debe indicar que el cuello uterino no está dilatado y que usted no ha roto bolsa. °Si no hay otros problemas de salud asociados con su embarazo, no habrá inconvenientes si la envían a su casa con un falso parto. Es posible que las contracciones de Braxton Hicks continúen hasta que se desencadene el parto verdadero. °Cómo diferenciar el trabajo de parto falso del verdadero °Trabajo de parto verdadero °· Las contracciones duran de 30 a 70 segundos. °· Las contracciones pueden tornarse muy regulares. °· La molestia generalmente se siente en la parte superior del útero y se extiende hacia la zona baja del abdomen y hacia la cintura. °· Las contracciones no desaparecen cuando usted camina. °· Las contracciones generalmente se hacen más  intensas y aumentan en frecuencia. °· El cuello uterino se dilata y se afina. °Parto falso °· En general, las contracciones son más cortas y no tan intensas como las del parto verdadero. °· En general, las contracciones son irregulares. °· A menudo, las contracciones se sienten en la parte delantera de la parte baja del abdomen y en la ingle. °· Las contracciones pueden desaparecer cuando usted camina o cambia de posición mientras está acostada. °· Las contracciones se vuelven más débiles y su duración es menor a medida que transcurre el tiempo. °· En general, el cuello uterino no se dilata ni se afina. °Siga estas indicaciones en su casa: ° °· Tome los medicamentos de venta libre y los recetados solamente como se lo haya indicado el médico. °· Continúe haciendo los ejercicios habituales y siga las demás indicaciones que el médico le dé. °· Coma y beba con moderación si cree que está de parto. °· Si las contracciones de Braxton Hicks le provocan incomodidad: °? Cambie de posición: si está acostada o descansando, camine; si está caminando, descanse. °? Siéntese y descanse en una bañera con agua tibia. °? Beba suficiente líquido como para mantener la orina de color amarillo pálido. La deshidratación puede provocar contracciones. °? Respire lenta y profundamente varias veces por hora. °· Vaya a todas las visitas de control prenatales y de control como se lo haya indicado el médico. Esto es importante. °Comuníquese con un médico si: °· Tiene fiebre. °· Siente dolor constante en el abdomen. °Solicite ayuda de inmediato si: °· Las contracciones se intensifican, se hacen más regulares y cercanas entre sí. °· Tiene una pérdida de líquido por la vagina. °· Elimina   una mucosidad sanguinolenta (pérdida del tapón mucoso). °· Tiene una hemorragia vaginal. °· Tiene un dolor en la zona lumbar que nunca tuvo antes. °· Siente que la cabeza del bebé empuja hacia abajo y ejerce presión en la zona pélvica. °· El bebé no se mueve tanto  como antes. °Resumen °· Las contracciones que se presentan antes del parto se conocen como contracciones de Braxton Hicks, falso parto o contracciones de práctica. °· En general, las contracciones de Braxton Hicks son más cortas, más débiles, con más tiempo entre una y otra, y menos regulares que las contracciones del parto verdadero. Las contracciones del parto verdadero se intensifican progresivamente y se tornan regulares y más frecuentes. °· Para controlar la molestia que producen las contracciones de Braxton Hicks, puede cambiar de posición, darse un baño templado y descansar, beber mucha agua o practicar la respiración profunda. °Esta información no tiene como fin reemplazar el consejo del médico. Asegúrese de hacerle al médico cualquier pregunta que tenga. °Document Released: 12/15/2016 Document Revised: 04/27/2017 Document Reviewed: 12/15/2016 °Elsevier Interactive Patient Education © 2019 [employer] ° °

## 2018-07-23 NOTE — MAU Note (Signed)
Contractions.  rom at 1140

## 2018-07-23 NOTE — Discharge Summary (Signed)
OB Discharge Summary     Patient Name: Connie Ferguson DOB: [birth year] MRN: [id number] Date of admission: 07/23/2018  Delivering MD: Sandi Raveling )  Date of discharge: 07/25/2018    Admitting diagnosis: Nanda Quinton 28 y.o. female G2P1001 with IUP at [redacted]w[redacted]d GBS+ presenting for SOL:  Active labour with SROM dilated to 8cm on transfer to the L&D Floor.  Intrauterine pregnancy: [redacted]w[redacted]d    Secondary diagnosis:  Active Problems:   Patient Active Problem List   Diagnosis Date Noted  . Group B streptococcal infection during pregnancy 07/23/2018    Additional problems: none      Discharge diagnosis: Term Pregnancy Delivered and GBS+ Inadequately treated intrapartum with Ampicillin due to rapid progression of labour                                                                                                 Post partum procedures:1st Degree laceration repair   Complications: None  Hospital course:  Onset of Labor With Vaginal Delivery     28 y.o. yo [id number] at [redacted]w[redacted]d was admitted in Active Labor on 07/23/2018. Patient had an uncomplicated labor course as follows:  Membrane Rupture Time/Date: 11:40 AM ,07/23/2018   Intrapartum Procedures: Episiotomy: None [1]                                         Lacerations:  1st degree [2];Perineal [11]  Patient had a delivery of a Viable infant. 07/23/2018  Information for the patient's newborn:  [place], Girl Tamyrah [[id number]]  Delivery Method: Vag-Spont(Filed from delivery)    Pateint had an uncomplicated postpartum course.  She is ambulating, tolerating a regular diet, passing flatus, and urinating well. Patient is discharged home in stable condition on 07/25/18.   Physical exam  Vitals:   07/24/18 2220 07/25/18 0500  BP: 105/68 110/75  Pulse: 79 75  Resp: 16 16  Temp: 98.3 F (36.8 C) 97.6 F (36.4 C)  SpO2:      General: alert, cooperative and no distress Lochia: appropriate Uterine Fundus:  firm Incision: Healing well with no significant drainage DVT Evaluation: No evidence of DVT seen on physical exam.  Labs: No results found for this or any previous visit (from the past 24 hour(s)).   Discharge instruction: per After Visit Summary and "Baby and Me Booklet".  After visit meds:  No Known Allergies  Allergies as of 07/25/2018   No Known Allergies     Medication List    STOP taking these medications   cimetidine 200 MG tablet Commonly known as:  TAGAMET   ranitidine 150 MG tablet Commonly known as:  ZANTAC     TAKE these medications   calcium carbonate 500 MG chewable tablet Commonly known as:  TUMS - dosed in mg elemental calcium Chew 1 tablet by mouth daily as needed for indigestion or heartburn.   ibuprofen 600 MG tablet Commonly known as:  ADVIL,MOTRIN Take 1 tablet (600 mg total) by mouth every 6 (  six) hours.   PRENATAL VITAMINS PO Take by mouth.        Diet: routine diet  Activity: Advance as tolerated. Pelvic rest for 6 weeks.   Outpatient follow up:4 weeks Future Appointments: No future appointments.  Follow up Appt: No follow-ups on file.   Postpartum contraception: Nexplanon  Newborn Data: APGAR (1 MIN): 9   APGAR (5 MINS): 9    Baby Feeding: Bottle and Breast Disposition:home with mother  Gwenevere Abbot, MD  07/25/2018

## 2018-07-23 NOTE — H&P (Addendum)
OBSTETRIC ADMISSION HISTORY AND PHYSICAL  Connie Ferguson is a 28 y.o. female G2P1001 with IUP at [redacted]w[redacted]d GBS+  presenting for Active labour with SROM at 1140hrs today she is having regular uterine contractions and is currently >8cm dilated with reassuring NST. She reports +FMs. Had LOF that was clear and says that she is still dripping, had VB spotting she is having no blurry vision, headaches, peripheral edema, or RUQ pain. She plans on Breast and bottle feeding. She requests Nexplanon for birth control.  Dating: By USS--->  Estimated Date of Delivery: 07/23/18   Prenatal History/Complications: None   Past Medical History: Past Medical History:  Diagnosis Date  . Anemia   . Trichomonas contact, treated     Past Surgical History: Past Surgical History:  Procedure Laterality Date  . NO PAST SURGERIES      Obstetrical History: OB History    Gravida  2   Para  1   Term  1   Preterm  0   AB  0   Living  1     SAB  0   TAB  0   Ectopic  0   Multiple  0   Live Births  1           Social History: Social History   Socioeconomic History  . Marital status: Married    Spouse name: Not on file  . Number of children: Not on file  . Years of education: Not on file  . Highest education level: Not on file  Occupational History  . Not on file  Social Needs  . Financial resource strain: Not on file  . Food insecurity:    Worry: Not on file    Inability: Not on file  . Transportation needs:    Medical: Not on file    Non-medical: Not on file  Tobacco Use  . Smoking status: Never Smoker  . Smokeless tobacco: Never Used  Substance and Sexual Activity  . Alcohol use: No  . Drug use: No  . Sexual activity: Yes  Lifestyle  . Physical activity:    Days per week: Not on file    Minutes per session: Not on file  . Stress: Not on file  Relationships  . Social connections:    Talks on phone: Not on file    Gets together: Not on file    Attends  religious service: Not on file    Active member of club or organization: Not on file    Attends meetings of clubs or organizations: Not on file    Relationship status: Not on file  Other Topics Concern  . Not on file  Social History Narrative  . Not on file    Family History: Family History  Problem Relation Age of Onset  . Diabetes Mother   . Hypertension Mother   . Heart disease Mother   . Alcohol abuse Neg Hx   . Arthritis Neg Hx   . Asthma Neg Hx   . Birth defects Neg Hx   . Cancer Neg Hx   . COPD Neg Hx   . Depression Neg Hx   . Drug abuse Neg Hx   . Early death Neg Hx   . Hearing loss Neg Hx   . Hyperlipidemia Neg Hx   . Kidney disease Neg Hx   . Learning disabilities Neg Hx   . Mental illness Neg Hx   . Mental retardation Neg Hx   . Miscarriages /  Stillbirths Neg Hx   . Stroke Neg Hx   . Vision loss Neg Hx     Allergies: No Known Allergies  Medications Prior to Admission  Medication Sig Dispense Refill Last Dose  . Acetaminophen (TYLENOL 8 HOUR PO) Take by mouth.   03/12/2018 at 1600  . calcium carbonate (TUMS - DOSED IN MG ELEMENTAL CALCIUM) 500 MG chewable tablet Chew 1 tablet by mouth daily as needed for indigestion or heartburn.   Past Week at Unknown time  . cimetidine (TAGAMET) 200 MG tablet Take 1 tablet (200 mg total) by mouth 2 (two) times daily. 60 tablet 1 More than a month at Unknown time  . Prenatal Multivit-Min-Fe-FA (PRENATAL VITAMINS PO) Take by mouth.   Not Taking  . Prenatal Vit-Fe Fumarate-FA (PRENATAL MULTIVITAMIN) TABS tablet Take 1 tablet by mouth daily at 12 noon.   07/22/2018 at Unknown time  . ranitidine (ZANTAC) 150 MG tablet Take 1 tablet (150 mg total) by mouth 2 (two) times daily as needed for heartburn. 60 tablet 2 More than a month at Unknown time     Review of Systems   All systems reviewed and negative except as stated in HPI  Blood pressure 122/70, pulse 86, temperature 98.8 F (37.1 C), temperature source Oral, resp. rate  18, weight 77.7 kg, last menstrual period 10/05/2017, SpO2 99 %, unknown if currently breastfeeding. General appearance: alert, cooperative and mild distress Lungs: regular rate and effort Heart: regular rate  Abdomen: soft, non-tender Extremities: Homans sign is negative, no sign of DVT Presentation: cephalic Fetal monitoringBaseline: 140 bpm, Variability: Good {> 6 bpm), Accelerations: Reactive and Decelerations: Absent Uterine activityFrequency: Every 4 minutes Dilation: 8.5 Effacement (%): 100 Station: -1 Exam by:: Leafy Ro, RNC   Prenatal labs: ABO, Rh:  O+ Antibody:  neg Rubella:  immune RPR:   non reactive  HBsAg:   non reactive  HIV:   non reactive  GBS:   Positive  2 hr GTT negative   Prenatal Transfer Tool  Maternal Diabetes: No Genetic Screening: Normal Maternal Ultrasounds/Referrals: Normal Fetal Ultrasounds or other Referrals:  None Maternal Substance Abuse:  No Significant Maternal Medications:  None Significant Maternal Lab Results: GBS negative   No results found for this or any previous visit (from the past 24 hour(s)).  Patient Active Problem List   Diagnosis Date Noted  . NSVD (normal spontaneous vaginal delivery) 09/22/2013  . Post term pregnancy, 42 weeks 09/21/2013    Assessment: Connie Ferguson is a 28 y.o. female G2P1001 with IUP at [redacted]w[redacted]d GBS+  presenting for Active labor with SROM at 1140hrs today she is having regular uterine contractions and is currently >8cm dilated with reassuring NST.   1.Active labour with SROM @ 1140hrs  She is GBS positive with SROM, will administer stat Ampicillin for GBS phrophylaxis intrapartum   1. Labor: Active  2. FWB: Cat 1 3. Pain: Moderate  4. GBS: Positive    Plan: 1. Admit L&D  2. Ampicillin 2g Q6h  3. Fenanyl 100 mcg for pain control Q2PRN  4. Expectant management of labor  5. Cervical check if pain increasing and after initial Abx dose.   Sandi Raveling, MD PGY1 Family Medicine  Resident [facility] [place]  07/23/2018, 2:38 PM   OB FELLOW HISTORY AND PHYSICAL ATTESTATION  I have seen and examined this patient; I agree with above documentation in the resident's note.   Marcy Siren, D.O. OB Fellow  07/23/2018, 5:30 PM

## 2018-07-23 NOTE — MAU Note (Signed)
PT SAYS WITH HUSBAND - [facility]  HD, VE  3 WEEKS AGO-  CLOSED.    UC STRONG SINCE 0210.   DOESN'T KNOW GBS

## 2018-07-24 LAB — RPR: RPR Ser Ql: NONREACTIVE

## 2018-07-24 NOTE — Lactation Note (Signed)
This note was copied from a baby's chart. Lactation Consultation Note  Patient Name: Connie Ferguson KCLEX'N Date: 07/24/2018 Reason for consult: Initial assessment;Term;Infant weight loss  19 hours old FT female who is being exclusively BF by her mother, she's a P2 but not very experienced BF. She had BF difficulties with her first child, he wouldn't latch and mom mostly pumped and bottle for 4 months. She's already supplementing baby with [employer] Gentle, doing both, breast and formula was her feeding choice on admission. Mom participated in the [facility] program at the [facility] this [facility]. LC assisted mom with hand expression and she was able to get big drops of colostrum very easily; praised her for her efforts.  Baby was finishing up with her bath when entering the room, offered assistance with latch and mom agreed to have her STS, RN Meriam Sprague recommended doing STS for the next hour covering baby up because of the bath. Explained to mom that just for this one time we want to keep baby warm and cozy (and covered) while at the breast, but that we normal do the feedings without blankets, spoke about the importance of STS during feedings, she voiced understanding.  LC took baby STS to mom's left breast and she was able to latch with very little difficulty but due to baby being sleepy, she kept unlatching and required constant repositioning until she finally fell asleep at the 11 minutes mark. Only a few audible swallows were noted in the beginning of the feeding, fort the last 5-6 minutes baby was dozing off and mainly sucking for comfort. Reviewed normal newborn behavior and feeding cues.  Feeding plan:  1. Encouraged mom to feed baby STS 8-12 times/24 hours or sooner if feeding cues are present 2. Hand expression and spoon feeding was also encouraged.  BF brochure (SP) and feeding diary were reviewed (SP). Mom reported all questions and concerns were answered, she's aware of LC services and  will call PRN.  Maternal Data Formula Feeding for Exclusion: Yes Reason for exclusion: Mother's choice to formula and breast feed on admission Has patient been taught Hand Expression?: Yes Does the patient have breastfeeding experience prior to this delivery?: Yes  Feeding Feeding Type: Breast Fed  LATCH Score Latch: Grasps breast easily, tongue down, lips flanged, rhythmical sucking.  Audible Swallowing: A few with stimulation(only in the beginning, baby very sleepy, she just had her bath)  Type of Nipple: Everted at rest and after stimulation  Comfort (Breast/Nipple): Soft / non-tender  Hold (Positioning): Assistance needed to correctly position infant at breast and maintain latch.  LATCH Score: 8  Interventions Interventions: Breast feeding basics reviewed;Assisted with latch;Skin to skin;Breast massage;Hand express;Breast compression;Adjust position;Support pillows  Lactation Tools Discussed/Used WIC Program: Yes   Consult Status Consult Status: Follow-up Date: 07/25/18 Follow-up type: In-patient    Shon Mansouri Venetia Constable 07/24/2018, 12:09 PM

## 2018-07-24 NOTE — Progress Notes (Signed)
Post Partum Day 1 Subjective: no complaints, up ad lib, voiding and tolerating PO  Objective: Blood pressure 108/70, pulse 87, temperature 98.9 F (37.2 C), temperature source Oral, resp. rate 16, height 5' 2.21" (1.58 m), weight 77.6 kg, last menstrual period 10/05/2017, SpO2 99 %, unknown if currently breastfeeding.  Physical Exam:  General: alert, cooperative, appears stated age and no distress Lochia: appropriate Uterine Fundus: firm Incision: NA DVT Evaluation: No evidence of DVT seen on physical exam.  Recent Labs    07/23/18 1445  HGB 10.0*  HCT 32.5*    Assessment/Plan: Plan for discharge tomorrow   LOS: 1 day   Gwenevere Abbot 07/24/2018, 9:34 AM

## 2018-07-25 ENCOUNTER — Encounter (HOSPITAL_COMMUNITY): Payer: Self-pay

## 2018-07-25 MED ORDER — IBUPROFEN 600 MG PO TABS: 600.0000 mg | ORAL_TABLET | Freq: Four times a day (QID) | ORAL | 0 refills | Status: AC

## 2018-07-25 NOTE — Lactation Note (Signed)
This note was copied from a baby's chart. Lactation Consultation Note  Patient Name: Connie Ferguson HWKGS'U Date: 07/25/2018 Reason for consult: Follow-up assessment   Video interpreter used for Spanish.  P2, Baby 40 hours old and latched at end of feeding upon entering. Feed on demand approximately 8-12 times per day.   Reviewed engorgement care and monitoring voids/stools.     Maternal Data    Feeding Feeding Type: Breast Fed  LATCH Score Latch: Grasps breast easily, tongue down, lips flanged, rhythmical sucking.  Audible Swallowing: A few with stimulation  Type of Nipple: Everted at rest and after stimulation  Comfort (Breast/Nipple): Filling, red/small blisters or bruises, mild/mod discomfort  Hold (Positioning): No assistance needed to correctly position infant at breast.  LATCH Score: 8  Interventions Interventions: Breast feeding basics reviewed  Lactation Tools Discussed/Used     Consult Status Consult Status: Complete Date: 07/25/18    Connie Ferguson [facility] 07/25/2018, 9:20 AM

## 2018-07-25 NOTE — Lactation Note (Signed)
This note was copied from a baby's chart. Lactation Consultation Note  Patient Name: Connie Ferguson [id number]'[id number] Date: 07/25/2018 Reason for consult: Follow-up assessment;Infant weight loss;Term;Engorgement  4 hours old FT female who is now being mostly BF by her mother, mom only did one formula feeding this morning, she's now showing signs of engorgement, her RN called for lactation assistance, mom and baby are going home today. Right breast is getting engorged, left one still have some soft spots. Mom voiced baby just fed off the left one but she fell asleep and she didn't do the right one, that's why it's "so hard".   RN gave mom a hand pump and she was able to express some milk but still needed lots of breast massage, breast compressions and finger tapping. LC brought two ice packs and instructed mom to ice her breast for at least 20 minutes prior attempting pumping or putting baby to the breast. LC recommended mom to pump and soften the breast first prior feedings at the breast. Mom will continue putting baby to the breast STS 8-12 times/24 hours.  Reviewed discharge instructions, engorgement prevention and treatment, treatment for sore nipples and red flags on when to call baby's pediatrician. Reassured mom she has enough breastmilk and no formula is needed at this point,stressed the importance of draining her breasts and feeding this milk to her baby. Mom understands that feeding plan will be revised once she takes baby to the pediatrician for follow up appointment in the next two days.   Parents reported all questions and concerns were answered, they're both aware of LC OP services and will contact PRN.  Maternal Data    Feeding Feeding Type: Breast Fed  LATCH Score Latch: Grasps breast easily, tongue down, lips flanged, rhythmical sucking.  Audible Swallowing: Spontaneous and intermittent  Type of Nipple: Everted at rest and after stimulation  Comfort (Breast/Nipple):  Engorged, cracked, bleeding, large blisters, severe discomfort(LC called-will see pt-breasts are very full-baby latched)  Hold (Positioning): No assistance needed to correctly position infant at breast.  LATCH Score: 8  Interventions Interventions: Hand express;Breast massage;Breast compression;Hand pump;Breast feeding basics reviewed;Ice  Lactation Tools Discussed/Used Tools: Pump;Other (comment)(ice packs) Breast pump type: Manual   Consult Status Consult Status: Complete Date: 07/25/18 Follow-up type: Call as needed    Alyssamae Klinck Venetia Constable 07/25/2018, 4:23 PM

## 2020-02-14 IMAGING — US US ABDOMEN LIMITED
1 series · 15 of 25 positions shown · non-contrast
Comparison: None.

CLINICAL DATA: 27-year-old pregnant female presenting with right
upper quadrant abdominal pain.

EXAM:
ULTRASOUND ABDOMEN LIMITED RIGHT UPPER QUADRANT

[Series 1: us abdomen limited · 15 of 43 slices shown]
[im 1/43]
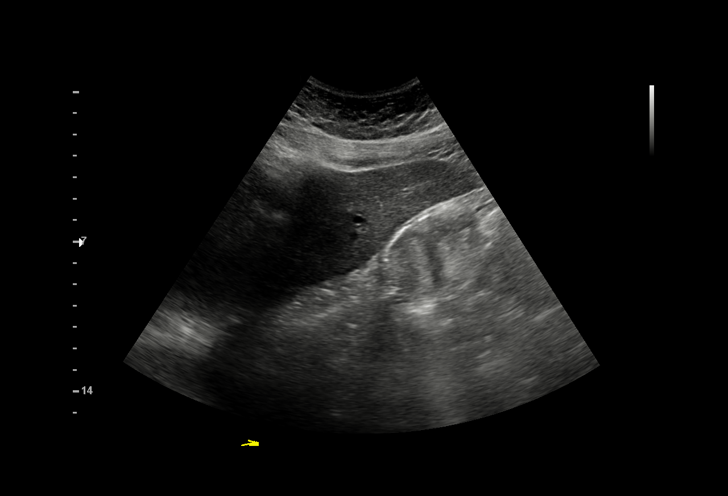
[im 4/43]
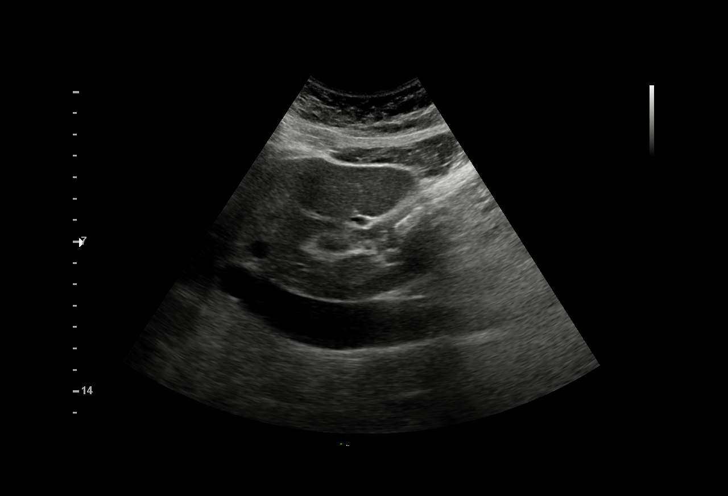
[im 8/43]
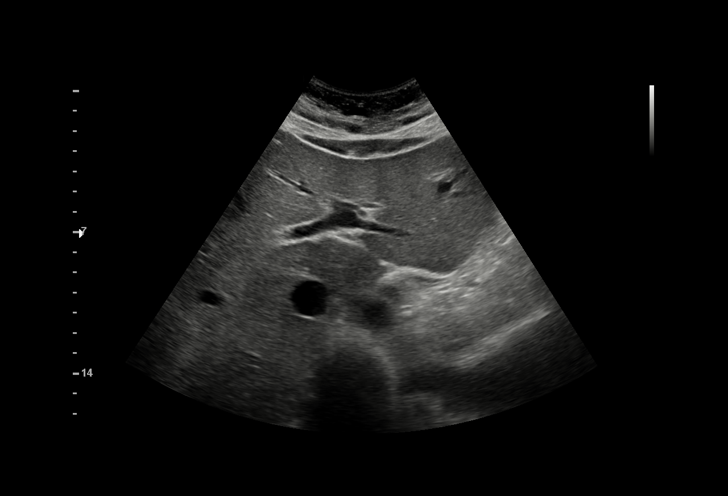
[im 9/43]
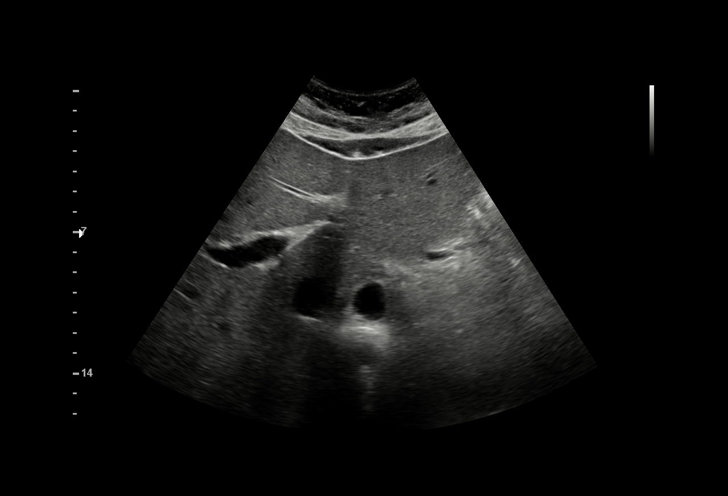
[im 13/43]
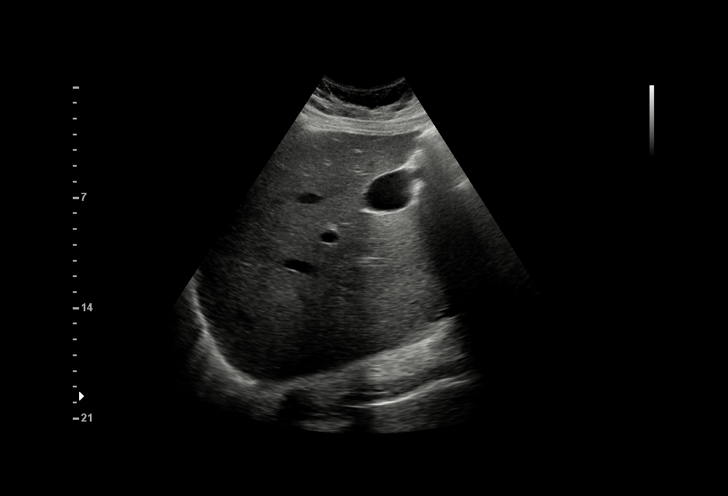
[im 16/43]
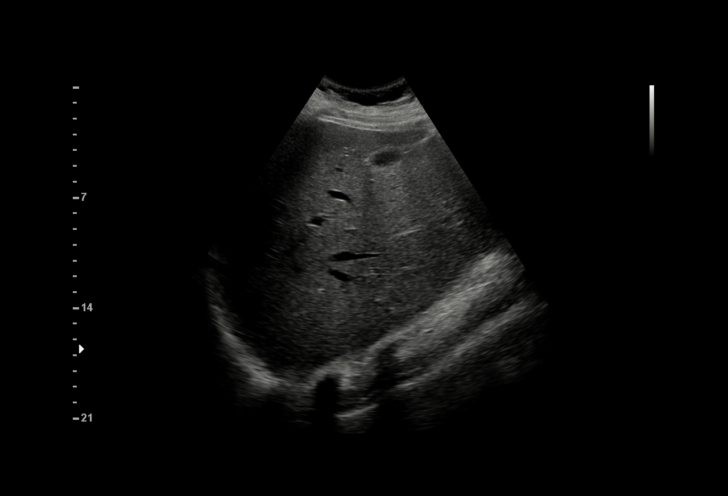
[im 18/43]
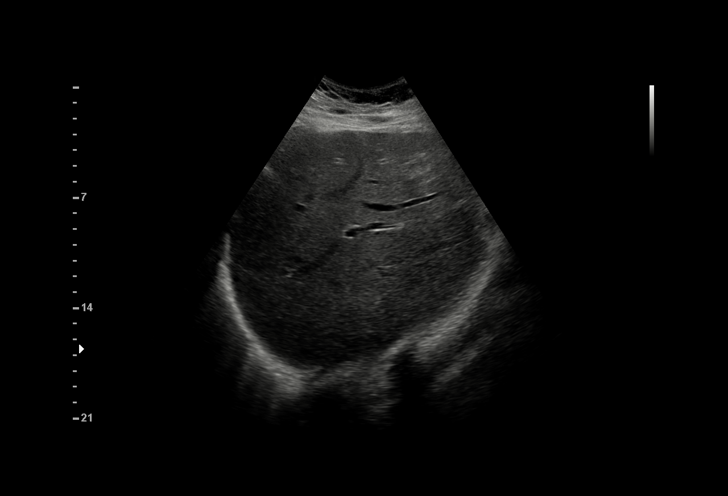
[im 22/43]
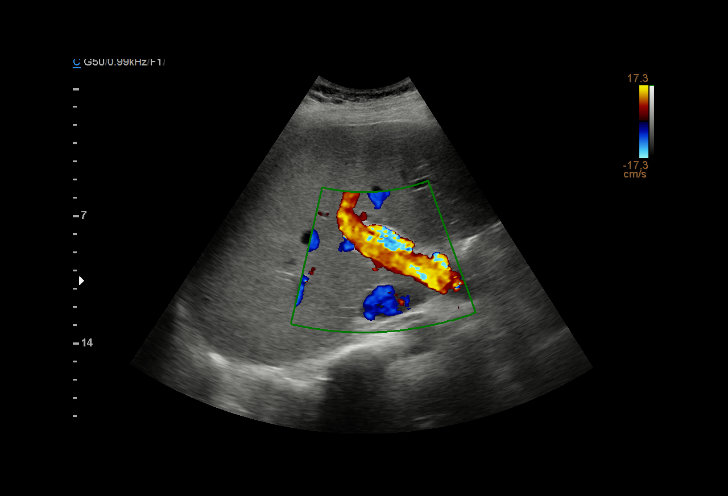
[im 25/43]
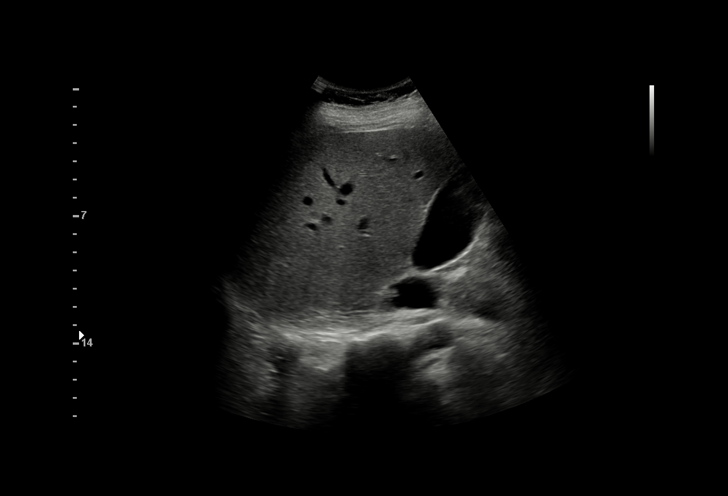
[im 27/43]
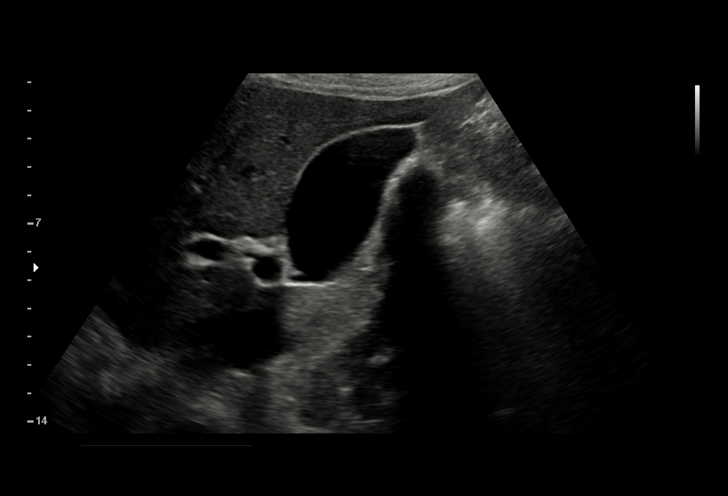
[im 30/43]
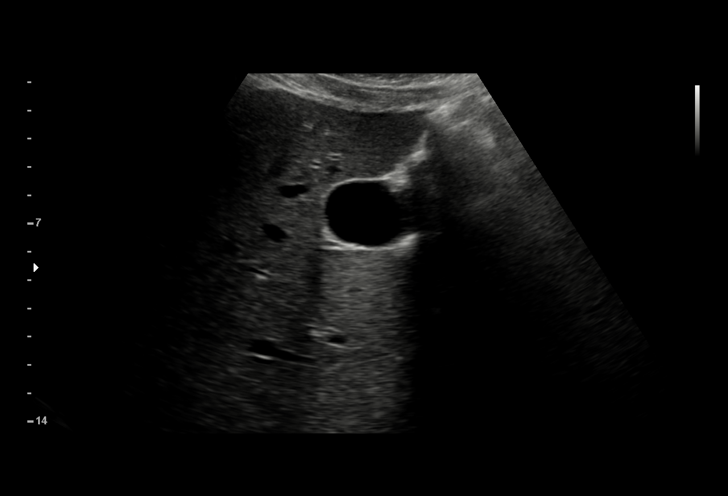
[im 34/43]
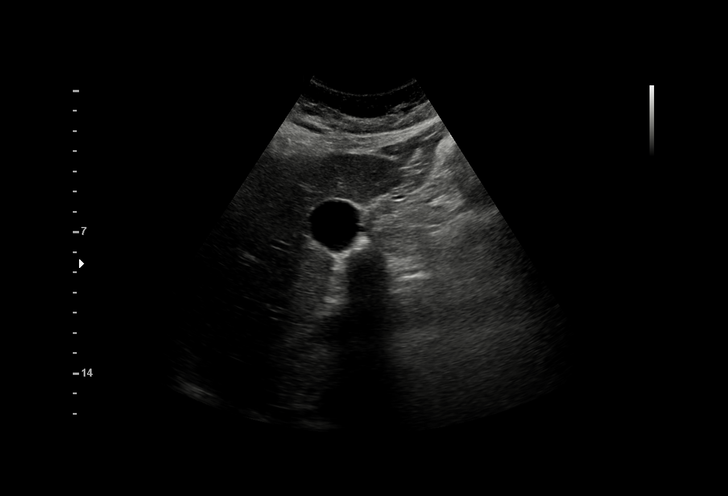
[im 36/43]
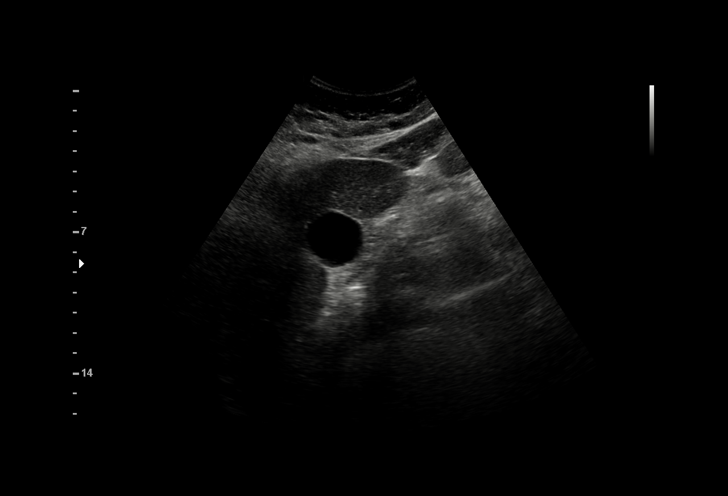
[im 39/43]
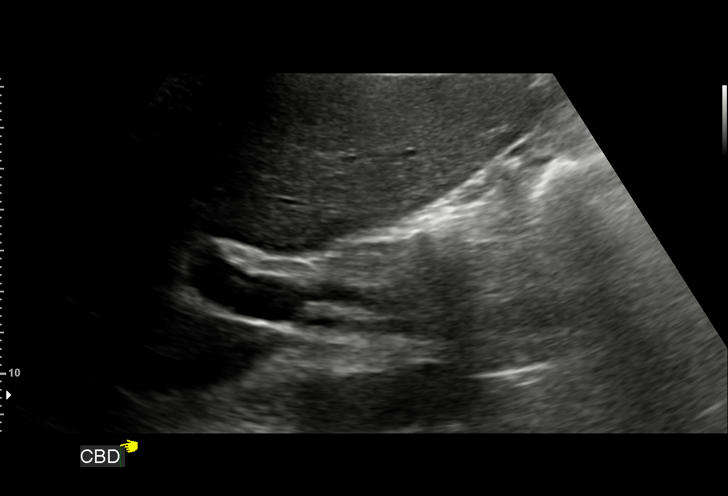
[im 43/43]
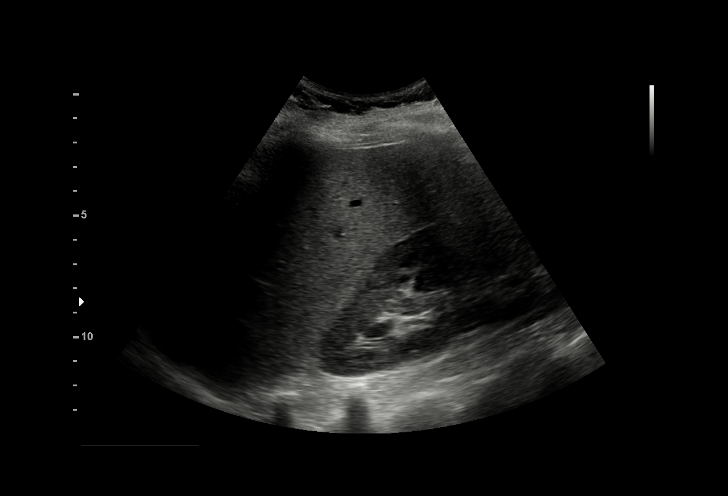

[15 of 25 positions shown; findings below may reference images not displayed]

FINDINGS: Gallbladder:

No gallstones or wall thickening visualized. No sonographic Murphy
sign noted by sonographer.

Common bile duct:

Diameter: 3 mm

Liver:

No focal lesion identified. Within normal limits in parenchymal
echogenicity. Portal vein is patent on color Doppler imaging with
normal direction of blood flow towards the liver.
IMPRESSION: Unremarkable right upper quadrant ultrasound.
